# Patient Record
Sex: Male | Born: 1960 | Race: Black or African American | Hispanic: No | Marital: Married | Smoking: Former smoker
Health system: Southern US, Community
[De-identification: ages and names within clinical notes are randomized; demographics above are authoritative.]

## PROBLEM LIST (undated history)

## (undated) DIAGNOSIS — M549 Dorsalgia, unspecified: Secondary | ICD-10-CM

## (undated) DIAGNOSIS — J189 Pneumonia, unspecified organism: Secondary | ICD-10-CM

---

## 2015-02-22 ENCOUNTER — Emergency Department (HOSPITAL_COMMUNITY): Payer: PRIVATE HEALTH INSURANCE

## 2015-02-22 ENCOUNTER — Encounter (HOSPITAL_COMMUNITY): Payer: Self-pay | Admitting: *Deleted

## 2015-02-22 ENCOUNTER — Emergency Department (HOSPITAL_COMMUNITY)
Admission: EM | Admit: 2015-02-22 | Discharge: 2015-02-22 | Disposition: A | Discharge status: Home / Self Care | Payer: PRIVATE HEALTH INSURANCE | Attending: Emergency Medicine | Admitting: Emergency Medicine

## 2015-02-22 DIAGNOSIS — M6281 Muscle weakness (generalized): Secondary | ICD-10-CM | POA: Diagnosis not present

## 2015-02-22 DIAGNOSIS — R200 Anesthesia of skin: Secondary | ICD-10-CM | POA: Diagnosis present

## 2015-02-22 DIAGNOSIS — Z87891 Personal history of nicotine dependence: Secondary | ICD-10-CM | POA: Diagnosis not present

## 2015-02-22 DIAGNOSIS — R29898 Other symptoms and signs involving the musculoskeletal system: Secondary | ICD-10-CM | POA: Diagnosis not present

## 2015-02-22 LAB — DIFFERENTIAL
BASOS ABS: 0 10*3/uL (ref 0.0–0.1)
Basophils Relative: 1 % (ref 0–1)
EOS ABS: 0.1 10*3/uL (ref 0.0–0.7)
Eosinophils Relative: 4 % (ref 0–5)
LYMPHS PCT: 29 % (ref 12–46)
Lymphs Abs: 1.1 10*3/uL (ref 0.7–4.0)
MONO ABS: 0.3 10*3/uL (ref 0.1–1.0)
Monocytes Relative: 7 % (ref 3–12)
NEUTROS ABS: 2.2 10*3/uL (ref 1.7–7.7)
Neutrophils Relative %: 59 % (ref 43–77)

## 2015-02-22 LAB — COMPREHENSIVE METABOLIC PANEL
ALT: 20 U/L (ref 17–63)
AST: 25 U/L (ref 15–41)
Albumin: 4.1 g/dL (ref 3.5–5.0)
Alkaline Phosphatase: 62 U/L (ref 38–126)
Anion gap: 7 (ref 5–15)
BUN: 12 mg/dL (ref 6–20)
CHLORIDE: 100 mmol/L — AB (ref 101–111)
CO2: 32 mmol/L (ref 22–32)
CREATININE: 1.17 mg/dL (ref 0.61–1.24)
Calcium: 9.5 mg/dL (ref 8.9–10.3)
GFR calc non Af Amer: >60 mL/min (ref >60)
Glucose, Bld: 146 mg/dL — ABNORMAL HIGH (ref 65–99)
POTASSIUM: 3.7 mmol/L (ref 3.5–5.1)
SODIUM: 139 mmol/L (ref 135–145)
Total Bilirubin: 0.6 mg/dL (ref 0.3–1.2)
Total Protein: 7.4 g/dL (ref 6.5–8.1)

## 2015-02-22 LAB — CBC
HEMATOCRIT: 40.3 % (ref 39.0–52.0)
HEMOGLOBIN: 13.3 g/dL (ref 13.0–17.0)
MCH: 22.8 pg — ABNORMAL LOW (ref 26.0–34.0)
MCHC: 33 g/dL (ref 30.0–36.0)
MCV: 69 fL — ABNORMAL LOW (ref 78.0–100.0)
PLATELETS: 207 10*3/uL (ref 150–400)
RBC: 5.84 MIL/uL — AB (ref 4.22–5.81)
RDW: 16 % — AB (ref 11.5–15.5)
WBC: 3.7 10*3/uL — ABNORMAL LOW (ref 4.0–10.5)

## 2015-02-22 LAB — I-STAT CHEM 8, ED
BUN: 15 mg/dL (ref 6–20)
Calcium, Ion: 1.26 mmol/L — ABNORMAL HIGH (ref 1.12–1.23)
Chloride: 98 mmol/L — ABNORMAL LOW (ref 101–111)
Creatinine, Ser: 1.2 mg/dL (ref 0.61–1.24)
Glucose, Bld: 148 mg/dL — ABNORMAL HIGH (ref 65–99)
HEMATOCRIT: 47 % (ref 39.0–52.0)
HEMOGLOBIN: 16 g/dL (ref 13.0–17.0)
POTASSIUM: 3.7 mmol/L (ref 3.5–5.1)
SODIUM: 140 mmol/L (ref 135–145)
TCO2: 30 mmol/L (ref 0–100)

## 2015-02-22 LAB — I-STAT TROPONIN, ED
TROPONIN I, POC: 0 ng/mL (ref 0.00–0.08)
TROPONIN I, POC: 0 ng/mL (ref 0.00–0.08)

## 2015-02-22 LAB — PROTIME-INR
INR: 1.07 (ref 0.00–1.49)
Prothrombin Time: 14.1 seconds (ref 11.6–15.2)

## 2015-02-22 LAB — APTT: aPTT: 33 seconds (ref 24–37)

## 2015-02-22 LAB — CBG MONITORING, ED: GLUCOSE-CAPILLARY: 153 mg/dL — AB (ref 65–99)

## 2015-02-22 NOTE — ED Provider Notes (Signed)
History   Chief Complaint  Patient presents with  . Numbness    HPI Previously healthy 54 year old male who states he has no medical problems although states he has not follow with her regular doctor presents to ED for evaluation of right upper extremity numbness and weakness which began after waking up this afternoon at 2 PM. Patient reports he went to bed around 2 AM and was in his usual normal state of health prior to this. Patient denies history of similar symptoms in the past. Denies other symptoms and specifically denies any chest pain, shortness breath, fevers, chills. Patient states in symptoms began his weakness and numbness are improving. Denies sleeping on the arm abnormally. Patient also denies neck pain or prior injury to his neck. Denies arm pain. Patient denies any family history of cardiac disease.  Past medical/surgical history, social history, medications, allergies and FH have been reviewed with patient and/or in documentation. Furthermore, if pt family or friend(s) present, additional historical information was obtained from them.  History reviewed. No pertinent past medical history. History reviewed. No pertinent past surgical history. No family history on file. Social History  Substance Use Topics  . Smoking status: Former Games developer  . Smokeless tobacco: None  . Alcohol Use: Yes     Comment: social     Review of Systems Constitutional: - F/C, -fatigue.  HENT: - congestion, -rhinorrhea, -sore throat.   Eyes: - eye pain, -visual disturbance.  Respiratory: - cough, -SOB, -hemoptysis.   Cardiovascular: - CP, -palps.  Gastrointestinal: - N/V/D, -abd pain  Genitourinary: - flank pain, -dysuria, -frequency.  Musculoskeletal: - myalgia/arthritis, -joint swelling, -gait abnormality, -back pain, -neck pain/stiffness, -leg pain/swelling.  Skin: - rash/lesion.  Neurological: + focal weakness, -lightheadedness, -dizziness, +numbness, -HA.  All other systems reviewed and are  negative.   Physical Exam  Physical Exam  ED Triage Vitals  Enc Vitals Group     BP 02/22/15 1930 139/96 mmHg     Pulse Rate 02/22/15 1930 62     Resp 02/22/15 1930 15     Temp --      Temp src --      SpO2 02/22/15 1930 100 %     Weight --      Height --      Head Cir --      Peak Flow --      Pain Score --      Pain Loc --      Pain Edu? --      Excl. in GC? --    Constitutional: Patient is well appearing and in no acute distress Head: Normocephalic and atraumatic.  Eyes: Extraocular motion intact, no scleral icterus Mouth: MMM, OP clear Neck: Supple without meningismus, mass, or overt JVD. Nontender neck. Respiratory: No respiratory distress. Normal WOB. No w/r/g. CV: RRR, no obvious murmurs.  Pulses +2 and symmetric. Euvolemic Abdomen: Soft, NT, ND, no r/g. No mass.  MSK: Nontender right upper extremity. No signs of trauma. Extremities are atraumatic without deformity, ROM intact Skin: Warm, dry, intact without rash Neuro: AAOx4, patient has weakness in his right upper extremity with weakness 4/5 primarily with biceps flexion. Wrist extension, arm extension are 5/5. Remainder extremity is 5/5. Patient has decreased sensation in the radial distribution involving the forearm thumb index and middle fingers. Patient has normal sensation in the ring and small finger in the medial portion of his forearm.    ED Course  Procedures   Labs Reviewed  CBC - Abnormal; Notable  for the following:    WBC 3.7 (*)    RBC 5.84 (*)    MCV 69.0 (*)    MCH 22.8 (*)    RDW 16.0 (*)    All other components within normal limits  COMPREHENSIVE METABOLIC PANEL - Abnormal; Notable for the following:    Chloride 100 (*)    Glucose, Bld 146 (*)    All other components within normal limits  CBG MONITORING, ED - Abnormal; Notable for the following:    Glucose-Capillary 153 (*)    All other components within normal limits  I-STAT CHEM 8, ED - Abnormal; Notable for the following:     Chloride 98 (*)    Glucose, Bld 148 (*)    Calcium, Ion 1.26 (*)    All other components within normal limits  PROTIME-INR  APTT  DIFFERENTIAL  I-STAT TROPOININ, ED  I-STAT TROPOININ, ED  I-STAT TROPOININ, ED  I-STAT TROPOININ, ED   I personally reviewed and interpreted all labs.  No results found. I personally viewed above image(s) which were used in my medical decision making. Formal interpretations by Radiology.   EKG Interpretation  Date/Time:  Sunday February 22 2015 18:37:29 EDT Ventricular Rate:  63 PR Interval:  156 QRS Duration: 98 QT Interval:  394 QTC Calculation: 403 R Axis:   -20 Text Interpretation:  Normal sinus rhythm Right atrial enlargement Incomplete right bundle branch block ST \\T \ T wave abnormality, consider inferior ischemia ST \\T \ T wave abnormality, consider anterior ischemia Abnormal ECG No old tracing to compare Confirmed by MILLER  MD, BRIAN ([id number]) on 02/22/2015 7:43:33 PM       MDM: Kaseem Vastine is a 54 y.o. male with H&P as above who p/w CC: Numbness.  On arrival patient is medically stable and in no apparent distress. Patient is neuro exam as above which is concerning for peripheral radiculopathy versus TIA. Patient will get screening labs, head CT, EKG.  EKG is abnormal and shows normal sinus rhythm, right atrial enlargement, incomplete right bundle branch block, T-wave inversions in the inferior and septal leads. No ST elevation or depression. No old EKG for comparison. Patient denies any chest pain, other symptoms concerning for ACS and denies having family history of cardiac disease.  Neurology was consult for further evaluation. Neurology recommends MRI for further evaluation. MRI is negative for acute infarct. Neurology feels patient is stable for discharge home with outpatient neurology follow-up for EMG. Cardiology was consulted and they reviewed patient's EKG over the phone and recommended patient have outpatient cardiology  evaluation. Patient remained stable while in ED and symptoms are reported to be improved. Patient is deemed stable for discharge. Old records reviewed (if available). Labs and imaging reviewed personally by myself and considered in medical decision making if ordered.  Clinical Impression: 1. Arm weakness     Disposition: Discharge  Condition: Good  I have discussed the results, Dx and Tx plan with the pt(& family if present). He/she/they expressed understanding and agree(s) with the plan. Discharge instructions discussed at great length. Strict return precautions discussed and pt &/or family have verbalized understanding of the instructions. No further questions at time of discharge.    New Prescriptions   No medications on file    Follow Up: [facility] EMERGENCY DEPARTMENT [address] [id number] mc [place] [address] [phone]  If symptoms worsen  [facility] [address] [place] [address] [phone] Schedule an appointment as soon as possible for  a visit in 1 week for EMG study  [facility] CARDIOVASCULAR DIVISION [address] [place] [address] [phone] Schedule an appointment as soon as possible for a visit in 1 week    Pt seen in conjunction with Dr. Eber Hong, MD  Ames Dura, DO [facility] Emergency Medicine Resident - PGY-3     Ames Dura, MD 02/22/15 [id number]  Eber Hong, MD 02/22/15 [phone]

## 2015-02-22 NOTE — ED Notes (Signed)
Patient transported to CT 

## 2015-02-22 NOTE — Consult Note (Addendum)
Referring Physician: ED    Chief Complaint: acute onset right arm weakness/numbness  HPI:                                                                                                                                         Taylor [name] is an 54 y.o. male without known significant past medical history, presents to the ED for evaluation of the above stated symptoms. Stated that he never had similar symptoms before, but woke up from at 2 pm today and realized that his right arm was " clumsy, numb, and couldn't pick up a glass and put it in a table". He said that there is not pain in the right UE or neck, no re\cent neck or arm trauma. The numbness involves the right thumb-index finger-right forearm. Mr. Cullens denies associated HA, vertigo, double vision, difficulty swallowing, slurred speech, language or vision impairment. No weakness of the right leg. CT brain performed today was independently reviewed and showed no acute abnormality.  Date last known well: 02/22/15 Time last known well: unclear tPA Given: no, late presentation   History reviewed. No pertinent past medical history.  History reviewed. No pertinent past surgical history.  No family history on file. Social History:  reports that he has quit smoking. He does not have any smokeless tobacco history on file. He reports that he drinks alcohol. He reports that he does not use illicit drugs.  Family history: no MS, epilepsy, brain tumor, or brain aneurysm Allergies: No Known Allergies  Medications:                                                                                                                           I have reviewed the patient's current medications.  ROS:  History obtained from chart review and the patient  General ROS: negative for - chills, fatigue, fever, night sweats, weight  gain or weight loss Psychological ROS: negative for - behavioral disorder, hallucinations, memory difficulties, mood swings or suicidal ideation Ophthalmic ROS: negative for - blurry vision, double vision, eye pain or loss of vision ENT ROS: negative for - epistaxis, nasal discharge, oral lesions, sore throat, tinnitus or vertigo Allergy and Immunology ROS: negative for - hives or itchy/watery eyes Hematological and Lymphatic ROS: negative for - bleeding problems, bruising or swollen lymph nodes Endocrine ROS: negative for - galactorrhea, hair pattern changes, polydipsia/polyuria or temperature intolerance Respiratory ROS: negative for - cough, hemoptysis, shortness of breath or wheezing Cardiovascular ROS: negative for - chest pain, dyspnea on exertion, edema or irregular heartbeat Gastrointestinal ROS: negative for - abdominal pain, diarrhea, hematemesis, nausea/vomiting or stool incontinence Genito-Urinary ROS: negative for - dysuria, hematuria, incontinence or urinary frequency/urgency Musculoskeletal ROS: negative for - joint swelling or muscular weakness Neurological ROS: as noted in HPI Dermatological ROS: negative for rash and skin lesion changes   Physical exam:  Constitutional: well developed, pleasant male in no apparent distress. Blood pressure 110/55, pulse 82, resp. rate 13, SpO2 99 %. Eyes: no jaundice or exophthalmos.  Head: normocephalic. Neck: supple, no bruits, no JVD. Cardiac: no murmurs. Lungs: clear. Abdomen: soft, no tender, no mass. Extremities: no edema, clubbing, or cyanosis.  Skin: no rash  Neurologic Examination:                                                                                                      General: Mental Status: Alert, oriented, thought content appropriate.  Speech fluent without evidence of aphasia.  Able to follow 3 step commands without difficulty. Cranial Nerves: II: Discs flat bilaterally; Visual fields grossly normal, pupils  equal, round, reactive to light and accommodation III,IV, VI: ptosis not present, extra-ocular motions intact bilaterally V,VII: smile symmetric, facial light touch sensation normal bilaterally VIII: hearing normal bilaterally IX,X: uvula rises symmetrically XI: bilateral shoulder shrug XII: midline tongue extension without atrophy or fasciculations Motor: 5/5 all over except for 4/5 weakness right biceps Tone and bulk:normal tone throughout; no atrophy noted Sensory: Pinprick and light touch diminished right forearm-right thumb-index fingers Deep Tendon Reflexes:  Right: Upper Extremity   Left: Upper extremity   biceps (C-5 to C-6) 2/4   biceps (C-5 to C-6) 2/4 tricep (C7) 2/4    triceps (C7) 2/4 Brachioradialis (C6) 2/4  Brachioradialis (C6) 2/4  Lower Extremity Lower Extremity  quadriceps (L-2 to L-4) 2/4   quadriceps (L-2 to L-4) 2/4 Achilles (S1) 2/4   Achilles (S1) 2/4  Plantars: Right: downgoing   Left: downgoing Cerebellar: normal finger-to-nose,  normal heel-to-shin test Gait:  No ataxia    Results for orders placed or performed during the hospital encounter of 02/22/15 (from the past 48 hour(s))  CBG monitoring, ED     Status: Abnormal   Collection Time: 02/22/15  6:35 PM  Result Value Ref Range   Glucose-Capillary 153 (H) 65 - 99 mg/dL  I-stat troponin, ED (not  at [facility], [facility])     Status: None   Collection Time: 02/22/15  6:53 PM  Result Value Ref Range   Troponin i, poc 0.00 0.00 - 0.08 ng/mL   Comment 3            Comment: Due to the release kinetics of cTnI, a negative result within the first hours of the onset of symptoms does not rule out myocardial infarction with certainty. If myocardial infarction is still suspected, repeat the test at appropriate intervals.   I-Stat Chem 8, ED  (not at [facility], [facility])     Status: Abnormal   Collection Time: 02/22/15  6:55 PM  Result Value Ref Range   Sodium 140 135 - 145 mmol/L   Potassium 3.7 3.5 - 5.1 mmol/L    Chloride 98 (L) 101 - 111 mmol/L   BUN 15 6 - 20 mg/dL   Creatinine, Ser 1.20 0.61 - 1.24 mg/dL   Glucose, Bld 148 (H) 65 - 99 mg/dL   Calcium, Ion 1.26 (H) 1.12 - 1.23 mmol/L   TCO2 30 0 - 100 mmol/L   Hemoglobin 16.0 13.0 - 17.0 g/dL   HCT 47.0 39.0 - 52.0 %  Protime-INR     Status: None   Collection Time: 02/22/15  7:10 PM  Result Value Ref Range   Prothrombin Time 14.1 11.6 - 15.2 seconds   INR 1.07 0.00 - 1.49  APTT     Status: None   Collection Time: 02/22/15  7:10 PM  Result Value Ref Range   aPTT 33 24 - 37 seconds  CBC     Status: Abnormal   Collection Time: 02/22/15  7:10 PM  Result Value Ref Range   WBC 3.7 (L) 4.0 - 10.5 K/uL   RBC 5.84 (H) 4.22 - 5.81 MIL/uL   Hemoglobin 13.3 13.0 - 17.0 g/dL   HCT 40.3 39.0 - 52.0 %   MCV 69.0 (L) 78.0 - 100.0 fL   MCH 22.8 (L) 26.0 - 34.0 pg   MCHC 33.0 30.0 - 36.0 g/dL   RDW 16.0 (H) 11.5 - 15.5 %   Platelets 207 150 - 400 K/uL  Differential     Status: None   Collection Time: 02/22/15  7:10 PM  Result Value Ref Range   Neutrophils Relative % 59 43 - 77 %   Lymphocytes Relative 29 12 - 46 %   Monocytes Relative 7 3 - 12 %   Eosinophils Relative 4 0 - 5 %   Basophils Relative 1 0 - 1 %   Neutro Abs 2.2 1.7 - 7.7 K/uL   Lymphs Abs 1.1 0.7 - 4.0 K/uL   Monocytes Absolute 0.3 0.1 - 1.0 K/uL   Eosinophils Absolute 0.1 0.0 - 0.7 K/uL   Basophils Absolute 0.0 0.0 - 0.1 K/uL  Comprehensive metabolic panel     Status: Abnormal   Collection Time: 02/22/15  7:10 PM  Result Value Ref Range   Sodium 139 135 - 145 mmol/L   Potassium 3.7 3.5 - 5.1 mmol/L   Chloride 100 (L) 101 - 111 mmol/L   CO2 32 22 - 32 mmol/L   Glucose, Bld 146 (H) 65 - 99 mg/dL   BUN 12 6 - 20 mg/dL   Creatinine, Ser 1.17 0.61 - 1.24 mg/dL   Calcium 9.5 8.9 - 10.3 mg/dL   Total Protein 7.4 6.5 - 8.1 g/dL   Albumin 4.1 3.5 - 5.0 g/dL   AST 25 15 - 41 U/L   ALT 20  17 - 63 U/L   Alkaline Phosphatase 62 38 - 126 U/L   Total Bilirubin 0.6 0.3 - 1.2 mg/dL    GFR calc non Af Amer >60 >60 mL/min   GFR calc Af Amer >60 >60 mL/min    Comment: (NOTE) The eGFR has been calculated using the CKD EPI equation. This calculation has not been validated in all clinical situations. eGFR's persistently <60 mL/min signify possible Chronic Kidney Disease.    Anion gap 7 5 - 15  I-Stat Troponin, ED - 0, 3, 6 hours (not at [facility])     Status: None   Collection Time: 02/22/15  7:59 PM  Result Value Ref Range   Troponin i, poc 0.00 0.00 - 0.08 ng/mL   Comment 3            Comment: Due to the release kinetics of cTnI, a negative result within the first hours of the onset of symptoms does not rule out myocardial infarction with certainty. If myocardial infarction is still suspected, repeat the test at appropriate intervals.    Ct Head Wo Contrast  02/22/2015   CLINICAL DATA:  Right-sided numbness and weakness beginning this morning. Initial encounter.  EXAM: CT HEAD WITHOUT CONTRAST  TECHNIQUE: Contiguous axial images were obtained from the base of the skull through the vertex without intravenous contrast.  COMPARISON:  None.  FINDINGS: The brain appears normal without hemorrhage, infarct, mass lesion, mass effect, midline shift or abnormal extra-axial fluid collection. No hydrocephalus or pneumocephalus. Imaged paranasal sinuses and mastoid air cells are clear. The calvarium is intact.  IMPRESSION: Negative head CT.   Electronically Signed   By: Inge Rise M.D.   On: 02/22/2015 20:07    Assessment: 54 y.o. male presents with acute onset right arm clumsiness/weakness and numbness right forearm-right thumb-index fingers. On neuro exam the weakness seems to be restricted to the right biceps, particularly no involvement of right UE muscles supply by the radial nerve. Pattern of weakness and sensory involvement not entirely consistent with a peripheral process, thus will recommend MRI brain to exclude the possibility of a small cortical infarct. Will follow up after  MRI.  Stroke Risk Factors - none  Dorian Pod ,MD [facility] [phone]  02/22/2015, 8:58 PM  Addendum:MRI brain negative for acute abnormality or other lesion that could explain his symptoms. D/C home with outpatient neurology follow up, may need NCS/EMG if symptoms persist.  Dorian Pod, MD

## 2015-02-22 NOTE — ED Provider Notes (Signed)
The patient is a 54 year old male, presents with right arm numbness and weakness which she discovered when he woke up at 2:00 PM. He went to sleep at 2:00 AM, he was normal at that time. He noticed that he could not hold his CAT very well. He denies any other symptoms including chest pain, shortness of breath, headache, blurred vision, speech difficulties or gait difficulties and has no complaints of his legs. On exam the patient has normal heart and lungs, normal abdomen, cranial nerves III through XII are normal, normal strength and sensation in the bilateral lower extremities, left upper extremities normal to strength and sensation, right upper extremity has some weakness at the biceps, no obvious weakness in the grip, no weakness with arm extension, slight numbness to the thumb and second finger, normal sensation to the  ulnar 3 fingers. normal extension at the wrist bilaterally. He does not report any changes in the way that he slept last night, he does have a radial nerve-type sensory distribution however the weakness does not correlate, we'll obtain labs, CT, discussed with neurology. In addition his EKG is very abnormal with T wave inversions and abnormal septal leads. He will need discussion with neurology, possibly cardiology.  I saw and evaluated the patient, reviewed the resident's note and I agree with the findings and plan.   EKG Interpretation  Date/Time:  Sunday February 22 2015 18:37:29 EDT Ventricular Rate:  63 PR Interval:  156 QRS Duration: 98 QT Interval:  394 QTC Calculation: 403 R Axis:   -20 Text Interpretation:  Normal sinus rhythm Right atrial enlargement Incomplete right bundle branch block ST \\T \ T wave abnormality, consider inferior ischemia ST \\T \ T wave abnormality, consider anterior ischemia Abnormal ECG No old tracing to compare Confirmed by Jaisa Defino  MD, Yeilyn Gent ([id number]) on 02/22/2015 7:43:33 PM        Final diagnoses:  Arm weakness        Eber Hong,  MD 02/22/15 [phone]

## 2015-02-22 NOTE — ED Notes (Signed)
Patient returned from CT

## 2015-02-22 NOTE — ED Notes (Signed)
Spoke with Dr. Hyacinth Meeker in regards to troponin order, states pt ok to be discharged.

## 2015-02-22 NOTE — ED Notes (Signed)
Pt states that he woke up at 1400 today with rt arm numbness. States he went to sleep at 0200. States he was fishing all day Friday. No other neuro deficits noted. Spoke with Dr Hyacinth Meeker regarding pt. Instructed to order stroke order set but not call code stroke.

## 2016-01-03 ENCOUNTER — Ambulatory Visit (HOSPITAL_COMMUNITY)
Admission: EM | Admit: 2016-01-03 | Discharge: 2016-01-03 | Disposition: A | Discharge status: Home / Self Care | Payer: BLUE CROSS/BLUE SHIELD | Attending: Emergency Medicine | Admitting: Emergency Medicine

## 2016-01-03 ENCOUNTER — Encounter (HOSPITAL_COMMUNITY): Payer: Self-pay | Admitting: Emergency Medicine

## 2016-01-03 DIAGNOSIS — M5441 Lumbago with sciatica, right side: Secondary | ICD-10-CM

## 2016-01-03 HISTORY — DX: Dorsalgia, unspecified: M54.9

## 2016-01-03 MED ORDER — CYCLOBENZAPRINE HCL 10 MG PO TABS: 10.0000 mg | ORAL_TABLET | Freq: Three times a day (TID) | ORAL | Status: DC | PRN

## 2016-01-03 MED ORDER — MELOXICAM 15 MG PO TABS: 15.0000 mg | ORAL_TABLET | Freq: Every day | ORAL | Status: DC
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
## 2016-01-03 NOTE — ED Notes (Addendum)
Back pain, pain into right leg, describes pain as spasm

## 2016-01-03 NOTE — ED Notes (Signed)
On discharge, patient asked about extending work note one day, dr Piedad Climeshonig agreed

## 2016-01-03 NOTE — Discharge Instructions (Signed)
It sounds like you had sciatica coming from muscle spasms. It looks like this is improving and healing well. Take meloxicam daily for the next 3-4 days, then as needed for pain. Use the Flexeril 3 times a day as needed. Apply heat several times a day, particularly when you are done with work. Do gentle stretching of your back, particularly after work. Follow-up as needed.

## 2017-01-21 ENCOUNTER — Emergency Department (HOSPITAL_COMMUNITY): Payer: Self-pay

## 2017-01-21 ENCOUNTER — Inpatient Hospital Stay (HOSPITAL_COMMUNITY)
Admission: EM | Admit: 2017-01-21 | Discharge: 2017-01-26 | DRG: 091 | Disposition: A | Discharge status: Home / Self Care | Payer: Self-pay | Attending: Family Medicine | Admitting: Family Medicine

## 2017-01-21 ENCOUNTER — Encounter (HOSPITAL_COMMUNITY): Payer: Self-pay | Admitting: Internal Medicine

## 2017-01-21 DIAGNOSIS — R778 Other specified abnormalities of plasma proteins: Secondary | ICD-10-CM

## 2017-01-21 DIAGNOSIS — G92 Toxic encephalopathy: Principal | ICD-10-CM | POA: Diagnosis present

## 2017-01-21 DIAGNOSIS — G929 Unspecified toxic encephalopathy: Secondary | ICD-10-CM | POA: Diagnosis present

## 2017-01-21 DIAGNOSIS — T50901A Poisoning by unspecified drugs, medicaments and biological substances, accidental (unintentional), initial encounter: Secondary | ICD-10-CM

## 2017-01-21 DIAGNOSIS — T401X1A Poisoning by heroin, accidental (unintentional), initial encounter: Secondary | ICD-10-CM | POA: Diagnosis not present

## 2017-01-21 DIAGNOSIS — Z791 Long term (current) use of non-steroidal anti-inflammatories (NSAID): Secondary | ICD-10-CM

## 2017-01-21 DIAGNOSIS — R410 Disorientation, unspecified: Secondary | ICD-10-CM

## 2017-01-21 DIAGNOSIS — I161 Hypertensive emergency: Secondary | ICD-10-CM | POA: Diagnosis present

## 2017-01-21 DIAGNOSIS — I21A1 Myocardial infarction type 2: Secondary | ICD-10-CM | POA: Diagnosis present

## 2017-01-21 DIAGNOSIS — R931 Abnormal findings on diagnostic imaging of heart and coronary circulation: Secondary | ICD-10-CM

## 2017-01-21 DIAGNOSIS — F1114 Opioid abuse with opioid-induced mood disorder: Secondary | ICD-10-CM

## 2017-01-21 DIAGNOSIS — F1110 Opioid abuse, uncomplicated: Secondary | ICD-10-CM | POA: Diagnosis present

## 2017-01-21 DIAGNOSIS — F39 Unspecified mood [affective] disorder: Secondary | ICD-10-CM | POA: Diagnosis present

## 2017-01-21 DIAGNOSIS — D509 Iron deficiency anemia, unspecified: Secondary | ICD-10-CM | POA: Diagnosis present

## 2017-01-21 DIAGNOSIS — T402X5A Adverse effect of other opioids, initial encounter: Secondary | ICD-10-CM | POA: Diagnosis present

## 2017-01-21 DIAGNOSIS — R7881 Bacteremia: Secondary | ICD-10-CM

## 2017-01-21 DIAGNOSIS — E873 Alkalosis: Secondary | ICD-10-CM

## 2017-01-21 DIAGNOSIS — A4159 Other Gram-negative sepsis: Secondary | ICD-10-CM | POA: Diagnosis not present

## 2017-01-21 DIAGNOSIS — R7989 Other specified abnormal findings of blood chemistry: Secondary | ICD-10-CM

## 2017-01-21 DIAGNOSIS — Z87891 Personal history of nicotine dependence: Secondary | ICD-10-CM

## 2017-01-21 DIAGNOSIS — R52 Pain, unspecified: Secondary | ICD-10-CM

## 2017-01-21 DIAGNOSIS — E876 Hypokalemia: Secondary | ICD-10-CM | POA: Diagnosis present

## 2017-01-21 DIAGNOSIS — B961 Klebsiella pneumoniae [K. pneumoniae] as the cause of diseases classified elsewhere: Secondary | ICD-10-CM

## 2017-01-21 DIAGNOSIS — J9601 Acute respiratory failure with hypoxia: Secondary | ICD-10-CM | POA: Diagnosis present

## 2017-01-21 DIAGNOSIS — M542 Cervicalgia: Secondary | ICD-10-CM

## 2017-01-21 DIAGNOSIS — J690 Pneumonitis due to inhalation of food and vomit: Secondary | ICD-10-CM | POA: Diagnosis present

## 2017-01-21 DIAGNOSIS — T50904A Poisoning by unspecified drugs, medicaments and biological substances, undetermined, initial encounter: Secondary | ICD-10-CM

## 2017-01-21 LAB — CBC WITH DIFFERENTIAL/PLATELET
Basophils Absolute: 0 10*3/uL (ref 0.0–0.1)
Basophils Relative: 0 %
Eosinophils Absolute: 0.1 10*3/uL (ref 0.0–0.7)
Eosinophils Relative: 1 %
HCT: 33.2 % — ABNORMAL LOW (ref 39.0–52.0)
Hemoglobin: 11.1 g/dL — ABNORMAL LOW (ref 13.0–17.0)
Lymphocytes Relative: 9 %
Lymphs Abs: 0.6 10*3/uL — ABNORMAL LOW (ref 0.7–4.0)
MCH: 21.6 pg — ABNORMAL LOW (ref 26.0–34.0)
MCHC: 33.4 g/dL (ref 30.0–36.0)
MCV: 64.7 fL — ABNORMAL LOW (ref 78.0–100.0)
Monocytes Absolute: 0.5 10*3/uL (ref 0.1–1.0)
Monocytes Relative: 7 %
Neutro Abs: 5.3 10*3/uL (ref 1.7–7.7)
Neutrophils Relative %: 83 %
Platelets: 203 10*3/uL (ref 150–400)
RBC: 5.13 MIL/uL (ref 4.22–5.81)
RDW: 16.1 % — ABNORMAL HIGH (ref 11.5–15.5)
WBC: 6.5 10*3/uL (ref 4.0–10.5)

## 2017-01-21 LAB — RAPID URINE DRUG SCREEN, HOSP PERFORMED
Amphetamines: NOT DETECTED
Barbiturates: NOT DETECTED
Benzodiazepines: POSITIVE — AB
COCAINE: NOT DETECTED
OPIATES: POSITIVE — AB
TETRAHYDROCANNABINOL: NOT DETECTED

## 2017-01-21 LAB — BLOOD GAS, ARTERIAL
Acid-Base Excess: 1.1 mmol/L (ref 0.0–2.0)
Bicarbonate: 24.3 mmol/L (ref 20.0–28.0)
Drawn by: 257881
FIO2: 100
MECHVT: 620 mL
O2 Saturation: 99.9 %
PEEP: 5 cmH2O
Patient temperature: 98.6
RATE: 18 resp/min
pCO2 arterial: 35.3 mmHg (ref 32.0–48.0)
pH, Arterial: 7.452 — ABNORMAL HIGH (ref 7.350–7.450)
pO2, Arterial: 447 mmHg — ABNORMAL HIGH (ref 83.0–108.0)

## 2017-01-21 LAB — COMPREHENSIVE METABOLIC PANEL
ALT: 48 U/L (ref 17–63)
AST: 66 U/L — ABNORMAL HIGH (ref 15–41)
Albumin: 3.7 g/dL (ref 3.5–5.0)
Alkaline Phosphatase: 98 U/L (ref 38–126)
Anion gap: 10 (ref 5–15)
BUN: 10 mg/dL (ref 6–20)
CO2: 26 mmol/L (ref 22–32)
Calcium: 8.7 mg/dL — ABNORMAL LOW (ref 8.9–10.3)
Chloride: 102 mmol/L (ref 101–111)
Creatinine, Ser: 1.17 mg/dL (ref 0.61–1.24)
GFR calc Af Amer: >60 mL/min (ref >60)
GFR calc non Af Amer: >60 mL/min (ref >60)
Glucose, Bld: 123 mg/dL — ABNORMAL HIGH (ref 65–99)
Potassium: 3.2 mmol/L — ABNORMAL LOW (ref 3.5–5.1)
Sodium: 138 mmol/L (ref 135–145)
Total Bilirubin: 0.3 mg/dL (ref 0.3–1.2)
Total Protein: 7.6 g/dL (ref 6.5–8.1)

## 2017-01-21 LAB — URINALYSIS, ROUTINE W REFLEX MICROSCOPIC
Bilirubin Urine: NEGATIVE
Glucose, UA: NEGATIVE mg/dL
Ketones, ur: 5 mg/dL — AB
Leukocytes, UA: NEGATIVE
Nitrite: NEGATIVE
Protein, ur: NEGATIVE mg/dL
Specific Gravity, Urine: 1.005 (ref 1.005–1.030)
Squamous Epithelial / LPF: NONE SEEN
pH: 8 (ref 5.0–8.0)

## 2017-01-21 LAB — ETHANOL: Alcohol, Ethyl (B): <5 mg/dL (ref <5)

## 2017-01-21 LAB — MAGNESIUM: Magnesium: 1.8 mg/dL (ref 1.7–2.4)

## 2017-01-21 LAB — TROPONIN I
Troponin I: 0.06 ng/mL (ref <0.03)
Troponin I: 0.11 ng/mL (ref <0.03)
Troponin I: 0.1 ng/mL (ref <0.03)

## 2017-01-21 LAB — CK: Total CK: 606 U/L — ABNORMAL HIGH (ref 49–397)

## 2017-01-21 LAB — SALICYLATE LEVEL: Salicylate Lvl: <7 mg/dL (ref 2.8–30.0)

## 2017-01-21 LAB — LACTIC ACID, PLASMA: Lactic Acid, Venous: 1.4 mmol/L (ref 0.5–1.9)

## 2017-01-21 LAB — ACETAMINOPHEN LEVEL: Acetaminophen (Tylenol), Serum: <10 ug/mL — ABNORMAL LOW (ref 10–30)

## 2017-01-21 MED ORDER — SODIUM CHLORIDE 0.9 % IV SOLN: 250.0000 mL | INTRAVENOUS | Status: DC | PRN

## 2017-01-21 MED ORDER — NALOXONE HCL 0.4 MG/ML IJ SOLN: 0.4000 mg | Freq: Once | INTRAMUSCULAR | Status: DC

## 2017-01-21 MED ORDER — SODIUM CHLORIDE 0.9 % IV BOLUS (SEPSIS)
1000.0000 mL | Freq: Once | INTRAVENOUS | Status: AC
  Administered 2017-01-21: 1000 mL via INTRAVENOUS

## 2017-01-21 MED ORDER — NALOXONE HCL 0.4 MG/ML IJ SOLN
0.2000 mg | Freq: Once | INTRAMUSCULAR | Status: AC
  Administered 2017-01-21: 0.2 mg via INTRAVENOUS

## 2017-01-21 MED ORDER — IPRATROPIUM-ALBUTEROL 0.5-2.5 (3) MG/3ML IN SOLN
3.0000 mL | Freq: Four times a day (QID) | RESPIRATORY_TRACT | Status: DC
  Administered 2017-01-22 – 2017-01-23 (×2): 3 mL via RESPIRATORY_TRACT
  Filled 2017-01-21 (×3): qty 3

## 2017-01-21 MED ORDER — PROPOFOL 1000 MG/100ML IV EMUL
5.0000 ug/kg/min | Freq: Once | INTRAVENOUS | Status: DC
  Administered 2017-01-21: 5 ug/kg/min via INTRAVENOUS

## 2017-01-21 MED ORDER — PROPOFOL 1000 MG/100ML IV EMUL
5.0000 ug/kg/min | INTRAVENOUS | Status: DC
  Administered 2017-01-22: 38.4 ug/kg/min via INTRAVENOUS
  Administered 2017-01-22 (×4): 40 ug/kg/min via INTRAVENOUS
  Filled 2017-01-21 (×5): qty 100

## 2017-01-21 MED ORDER — PROPOFOL 1000 MG/100ML IV EMUL
INTRAVENOUS | Status: AC
  Filled 2017-01-21: qty 100

## 2017-01-21 MED ORDER — ACETAMINOPHEN 325 MG PO TABS
650.0000 mg | ORAL_TABLET | ORAL | Status: DC | PRN
  Administered 2017-01-22 – 2017-01-25 (×6): 650 mg via ORAL
  Filled 2017-01-21 (×6): qty 2

## 2017-01-21 MED ORDER — POTASSIUM CHLORIDE 10 MEQ/100ML IV SOLN
10.0000 meq | INTRAVENOUS | Status: AC
  Administered 2017-01-21 (×2): 10 meq via INTRAVENOUS
  Filled 2017-01-21 (×2): qty 100

## 2017-01-21 MED ORDER — KCL IN DEXTROSE-NACL 40-5-0.45 MEQ/L-%-% IV SOLN
INTRAVENOUS | Status: AC
  Administered 2017-01-21: 22:00:00 via INTRAVENOUS
  Filled 2017-01-21: qty 1000

## 2017-01-21 MED ORDER — MAGNESIUM SULFATE 2 GM/50ML IV SOLN
2.0000 g | Freq: Once | INTRAVENOUS | Status: AC
  Administered 2017-01-21: 2 g via INTRAVENOUS
  Filled 2017-01-21: qty 50

## 2017-01-21 MED ORDER — ENOXAPARIN SODIUM 40 MG/0.4ML ~~LOC~~ SOLN
40.0000 mg | SUBCUTANEOUS | Status: DC
  Administered 2017-01-21 – 2017-01-25 (×5): 40 mg via SUBCUTANEOUS
  Filled 2017-01-21 (×5): qty 0.4

## 2017-01-21 MED ORDER — PANTOPRAZOLE SODIUM 40 MG PO PACK
40.0000 mg | PACK | Freq: Every day | ORAL | Status: DC
  Administered 2017-01-21 – 2017-01-22 (×2): 40 mg
  Filled 2017-01-21 (×2): qty 20

## 2017-01-21 MED ORDER — IPRATROPIUM-ALBUTEROL 0.5-2.5 (3) MG/3ML IN SOLN
RESPIRATORY_TRACT | Status: AC
  Administered 2017-01-21
  Filled 2017-01-21: qty 3

## 2017-01-21 MED ORDER — CHLORHEXIDINE GLUCONATE 0.12% ORAL RINSE (MEDLINE KIT)
15.0000 mL | Freq: Two times a day (BID) | OROMUCOSAL | Status: DC
  Administered 2017-01-21 – 2017-01-22 (×2): 15 mL via OROMUCOSAL

## 2017-01-21 MED ORDER — ORAL CARE MOUTH RINSE
15.0000 mL | Freq: Four times a day (QID) | OROMUCOSAL | Status: DC
  Administered 2017-01-22 (×2): 15 mL via OROMUCOSAL

## 2017-01-21 NOTE — Procedures (Signed)
Intubation Procedure Note Taylor [name] [id number] 04/13/1961  Procedure: Intubation Indications: Airway protection and maintenance  Procedure Details Consent: Risks of procedure as well as the alternatives and risks of each were explained to the (patient/caregiver).  Consent for procedure obtained. Time Out: Verified patient identification, verified procedure, site/side was marked, verified correct patient position, special equipment/implants available, medications/allergies/relevent history reviewed, required imaging and test results available.  Performed  Maximum sterile technique was used including hand hygiene.  MAC and 4    Evaluation Hemodynamic Status: BP stable throughout; O2 sats: transiently fell during during procedure Patient's Current Condition: stable Complications: No apparent complications Patient did tolerate procedure well. Chest X-ray ordered to verify placement.  CXR: pending.   [name] 01/21/2017

## 2017-01-21 NOTE — Progress Notes (Signed)
Pts. height measured@ 72" with 8cc/kg IBW being 620 cc, transported uneventfully to ICU/stepdown 1231 with RN and family, RT to monitor.

## 2017-01-21 NOTE — ED Triage Notes (Signed)
Pt from home via EMS after using unknown drugs. EMS reports that pt was found with needle in pocket, diaphoretic and combative. Pt was given 5mg  IM haldol and versed for combative behavior. Pt arrives with shallow respirations and RR of approx 8. Pt had assisted breathing via bag mask with nasal trumpet in place. Dr Juleen Chinakohut at bedside and removed nasal trumpet.

## 2017-01-21 NOTE — H&P (Signed)
PULMONARY / CRITICAL CARE MEDICINE   Name: Taylor [name] MRN: [id number] DOB: May 30, 1961    ADMISSION DATE:  01/21/2017  CHIEF COMPLAINT:  AMS  HISTORY OF PRESENT ILLNESS:   HPI from fiance 2/2 patient intubated sedated.  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  Today she came home and found him unresponsive and called EMS. Patient was altered and combative requiring chemical restraint and eventual intubation for airway protection and to allow for adequate sedation.  PAST MEDICAL HISTORY :  He  has a past medical history of Back pain.  PAST SURGICAL HISTORY: He  has no past surgical history on file.  None per fiance  No Known Allergies  No current facility-administered medications on file prior to encounter.    Current Outpatient Prescriptions on File Prior to Encounter  Medication Sig  . cyclobenzaprine (FLEXERIL) 10 MG tablet Take 1 tablet (10 mg total) by mouth 3 (three) times daily as needed for muscle spasms.  . cyclobenzaprine (FLEXERIL) 10 MG tablet Take 10 mg by mouth 3 (three) times daily as needed for muscle spasms.  . meloxicam (MOBIC) 15 MG tablet Take 1 tablet (15 mg total) by mouth daily.  Marland Kitchen OVER THE COUNTER MEDICATION Apply 1 application topically daily as needed (itching). Over the counter cream for itching  Unknown if taking any Rx meds  FAMILY HISTORY:  His has no family status information on file.  Unable to obtain, fiance does not know  SOCIAL HISTORY: He  reports that he has quit smoking. He does not have any smokeless tobacco history on file. He reports that he drinks alcohol. He reports that he uses drugs, including Heroin.  REVIEW OF SYSTEMS:   Unable to obtain 2/2 intubated and sedated  SUBJECTIVE:  Unable to obtain 2/2 intubated and sedated  VITAL SIGNS: BP 133/87   Pulse 88   Resp 18   Ht 6\' 1"  (1.854 m)   Wt 160 lb (72.6 kg)   SpO2 100%   BMI 21.11 kg/m   HEMODYNAMICS:     VENTILATOR SETTINGS: FiO2 (%):  [100 %] 100 % Set Rate:  [18 bmp] 18 bmp Vt Set:  [782 mL] 620 mL PEEP:  [5 cmH20] 5 cmH20 Plateau Pressure:  [16 cmH20] 16 cmH20  INTAKE / OUTPUT: No intake/output data recorded.  PHYSICAL EXAMINATION: General:  Appears in good health younger than stated age Neuro:  Responsive to noxious HEENT:  ATNC, PERRLA, MOMM Cardiovascular:  RRR s1s2, no murmu, no cce, cap refill <2s Lungs:  CTA bilateral, no wheezing or rales, Abdomen:  +BS soft ntnd no hsm Musculoskeletal:  No red/warm/swollen/tender/deformity Skin:  Warm dry intact  LABS:  BMET  Recent Labs Lab 01/21/17 1743  NA 138  K 3.2*  CL 102  CO2 26  BUN 10  CREATININE 1.17  GLUCOSE 123*    Electrolytes  Recent Labs Lab 01/21/17 1743  CALCIUM 8.7*  MG 1.8    CBC  Recent Labs Lab 01/21/17 1743  WBC 6.5  HGB 11.1*  HCT 33.2*  PLT 203    Coag's No results for input(s): APTT, INR in the last 168 hours.  Sepsis Markers  Recent Labs Lab 01/21/17 1744  LATICACIDVEN 1.4    ABG  Recent Labs Lab 01/21/17 1903  PHART 7.452*  PCO2ART 35.3  PO2ART 447*    Liver Enzymes  Recent Labs Lab 01/21/17 1743  AST 66*  ALT 48  ALKPHOS 98  BILITOT 0.3  ALBUMIN 3.7    Cardiac Enzymes  Recent Labs Lab 01/21/17 1743  TROPONINI 0.06*    Glucose No results for input(s): GLUCAP in the last 168 hours.  Imaging Ct Head Wo Contrast  Result Date: 01/21/2017 CLINICAL DATA:  Drug overdose, agitation EXAM: CT HEAD WITHOUT CONTRAST TECHNIQUE: Contiguous axial images were obtained from the base of the skull through the vertex without intravenous contrast. COMPARISON:  MRI brain dated 02/22/2015 FINDINGS: Brain: No evidence of acute infarction, hemorrhage, hydrocephalus, extra-axial collection or mass lesion/mass effect. Vascular: No hyperdense vessel or unexpected calcification. Skull: Normal. Negative for fracture or focal lesion. Sinuses/Orbits: The visualized  paranasal sinuses are essentially clear. The mastoid air cells are unopacified. Other: None. IMPRESSION: Normal head CT. Electronically Signed   By: Charline Bills M.D.   On: 01/21/2017 18:47   Dg Chest Portable 1 View  Result Date: 01/21/2017 CLINICAL DATA:  Intubation EXAM: PORTABLE CHEST 1 VIEW COMPARISON:  Portable exam 1734 hours without priors for comparison. FINDINGS: Tip of endotracheal tube projects 4.8 cm above carina. Nasogastric tube extends into stomach. Normal heart size, mediastinal contours, and pulmonary vascularity. Minimal atelectasis or infiltrate in retrocardiac LEFT lower lobe. Remaining lungs clear. No pleural effusion or pneumothorax. Osseous structures unremarkable. IMPRESSION: Minimal atelectasis versus infiltrate in retrocardiac LEFT lower lobe. Electronically Signed   By: Ulyses Southward M.D.   On: 01/21/2017 17:49   Dg Abd Portable 1 View  Result Date: 01/21/2017 CLINICAL DATA:  Nasogastric tube placement EXAM: PORTABLE ABDOMEN - 1 VIEW COMPARISON:  Portable exam 1734 hours without priors for comparison. FINDINGS: Nasogastric tube in stomach with tip projecting over distal antrum. Visualized bowel gas pattern normal. Atelectasis versus infiltrate in retrocardiac LEFT lower lobe. RIGHT lung base clear. Osseous structures unremarkable. IMPRESSION: Tip of nasogastric tube projects over gastric antrum. Atelectasis versus infiltrate in retrocardiac LEFT lower lobe. Electronically Signed   By: Ulyses Southward M.D.   On: 01/21/2017 17:50     STUDIES:  EKG shows Sinus tachy, LAE, LVH, RsR' TWI V1-3 and min ST changes in V3-4. EKG is different than baseline  CULTURES: None  ANTIBIOTICS: None  SIGNIFICANT EVENTS: Intubated 7/21 >>  LINES/TUBES: ETT OG Foley  DISCUSSION: Suspect likely toxic ingestion and withdrawal leading to agitation and need for intubation for airway protection.  Elevated troponin likely Type 2 NSTEMI in setting of documented agiation and eelvated blood  pressures, cannot r/o Type 1 at present. Anemia needs w/u for GI source/IDA given low MCV.  Resp Alkalosis is iatrogenic.  ASSESSMENT / PLAN:  PULMONARY A: Intubate dairway protection P:   LTVV Repeat ABG CARDIOVASCULAR A:  Type 2 nstemi P:  Serial ekg and troponin Hold asa and heparin pending serial enzymes/ekgs  RENAL A:   hypokalemia P:   Replete, follow lytes  GASTROINTESTINAL A:   GI PPX P:   PPI  HEMATOLOGIC A:   Anemia P:  Ferritin, fobt  INFECTIOUS A:   Risk VAP P:   VAP PPX  ENDOCRINE A:   None   P:   Monitor glucose  NEUROLOGIC A:   Toxic encephalopathy P:   RASS goal: -1 F/u urne drug screen Fentanyl pain Propofol sedation   FAMILY  - Updates: fiance updated bedside  - Inter-disciplinary family meet or Palliative Care meeting due by:  day 7  45 minutes critical care time >22minutes direct face-to-face with patient and his surrogate bedside and coordination of care with RN, reviewing and ordering labs, reviewing CXR, ordering medications  Evans Lance, MD  Pulmonary and Critical Care Medicine [facility] Pager: [phone]  01/21/2017, 8:19 PM

## 2017-01-21 NOTE — ED Notes (Signed)
Dr Juleen ChinaKohut made aware of + Troponin 0.06

## 2017-01-21 NOTE — ED Notes (Signed)
Bed: RESA Expected date:  Expected time:  Means of arrival:  Comments: 

## 2017-01-21 NOTE — ED Provider Notes (Signed)
INTUBATION Performed by: Thermon LeylandHedges,Ezinne Yogi Todd  Required items: required blood products, implants, devices, and special equipment available Patient identity confirmed: provided demographic data and hospital-assigned identification number Time out: Immediately prior to procedure a "time out" was called to verify the correct patient, procedure, equipment, support staff and site/side marked as required.  Indications: overdose  Intubation method: Glidescope Laryngoscopy   Preoxygenation: BVM  Sedatives:  20 Etomidate Paralytic: 80 Succinylcholine  Tube Size: 8 cuffed  Post-procedure assessment: chest rise and ETCO2 monitor Breath sounds: equal and absent over the epigastrium Tube secured with: ETT holder Chest x-ray interpreted by radiologist and me.  Chest x-ray findings: endotracheal tube in appropriate position  Patient tolerated the procedure well with no immediate complications.     Eyvonne MechanicHedges, My Rinke, PA-C 01/21/17 1948    Raeford RazorKohut, Stephen, MD 01/29/17 [phone]

## 2017-01-21 NOTE — ED Notes (Signed)
Pt noted to be biting tube -propofol increased

## 2017-01-21 NOTE — ED Notes (Signed)
Pt found to need RSI per Dr Juleen ChinaKohut: 1727 Time out 1728 Etomidate 20 mg IV 1729 Roc 80 mg IV 1731 Pt intubated by Mamie LaurelJ, Hedges w/ Dr Juleen ChinaKohut @ bedside as well as RT 1731 + color change CO2- 24 @ lip, size 8 tube

## 2017-01-21 NOTE — ED Notes (Signed)
RSI being performed d/t breathing and agitation per Juleen ChinaKohut.

## 2017-01-21 NOTE — Progress Notes (Signed)
eLink Physician-Brief Progress Note Patient Name: Jake Sampleslbert Sneeringer DOB: 01/20/1961 MRN: [id number]   Date of Service  01/21/2017  HPI/Events of Note  Agitation - Request to renew Propofol order.  eICU Interventions  Will renew Propofol IV infusion order.      Intervention Category Minor Interventions: Agitation / anxiety - evaluation and management  Philana Younis Eugene 01/21/2017, 10:03 PM

## 2017-01-21 NOTE — ED Provider Notes (Signed)
[facility]-EMERGENCY DEPT Provider Note   CSN: [id number] Arrival date & time: 01/21/17  1659     History   Chief Complaint Chief Complaint  Patient presents with  . Drug Overdose    HPI Taylor [name] is a 56 y.o. male.  HPI   56yM with agitation. EMS called out to pt's residence by family for agitation/bizzarre behavior. Unknown when last seen normal. Reportedly history of drug abuse and EMS found a needle in his pocket. On their arrival he was crawling around on the ground. Diaphoretic. Combative. Wasn't speaking or following commands. Had to be given 5mg  haldol and 5mg  versed. EMS reports initially pupils 5-6 mm and symmetric. En route became apneic and pupils constricted. Glucose in 100s. No reported trauma. Medical history unknown. Arrived to ED with assisted ventilations.   Past Medical History:  Diagnosis Date  . Back pain     There are no active problems to display for this patient.   No past surgical history on file.     Home Medications    Prior to Admission medications   Medication Sig Start Date End Date Taking? Authorizing Provider  cyclobenzaprine (FLEXERIL) 10 MG tablet Take 1 tablet (10 mg total) by mouth 3 (three) times daily as needed for muscle spasms. 01/03/16   Charm Rings, MD  cyclobenzaprine (FLEXERIL) 10 MG tablet Take 10 mg by mouth 3 (three) times daily as needed for muscle spasms.    [provider]  meloxicam (MOBIC) 15 MG tablet Take 1 tablet (15 mg total) by mouth daily. 01/03/16   Charm Rings, MD  OVER THE COUNTER MEDICATION Apply 1 application topically daily as needed (itching). Over the counter cream for itching    [provider]    Family History No family history on file.  Social History Social History  Substance Use Topics  . Smoking status: Former Games developer  . Smokeless tobacco: Not on file  . Alcohol use Yes     Comment: social     Allergies   Patient has no known allergies.   Review of Systems Review of  Systems  Level 5 caveat because pt nonverbal.  Physical Exam Updated Vital Signs BP (!) 137/103 (BP Location: Right Arm)   Pulse (!) 102   Resp 10   SpO2 100%   Physical Exam  Constitutional: He appears distressed.  HENT:  Head: Normocephalic.  Mouth/Throat: Oropharynx is clear and moist.  Superficial abrasions to forehead  Eyes: Conjunctivae are normal. Right eye exhibits no discharge. Left eye exhibits no discharge.  Pupils pinpoint  Neck: Neck supple.  Cardiovascular: Regular rhythm and normal heart sounds.  Exam reveals no gallop and no friction rub.   No murmur heard. tachycardic  Pulmonary/Chest: Breath sounds normal.  Irregular shallow respirations with rate around 6.  Abdominal: Soft. He exhibits no distension.  Genitourinary: Penis normal.  Musculoskeletal: He exhibits no edema.  Scattered abrasions to b/l forearms.   Neurological: GCS eye subscore is 1. GCS verbal subscore is 1. GCS motor subscore is 4.  Not following commands. Withdrawing all extremities to pain. Muscle tone seems normal.   Skin: Skin is warm and dry.  Nursing note and vitals reviewed.    ED Treatments / Results  Labs (all labs ordered are listed, but only abnormal results are displayed) Labs Reviewed  CBC WITH DIFFERENTIAL/PLATELET  COMPREHENSIVE METABOLIC PANEL  URINALYSIS, ROUTINE W REFLEX MICROSCOPIC  RAPID URINE DRUG SCREEN, HOSP PERFORMED  ETHANOL  CK  LACTIC ACID, PLASMA  LACTIC  ACID, PLASMA  TROPONIN I  MAGNESIUM  BLOOD GAS, ARTERIAL  SALICYLATE LEVEL  ACETAMINOPHEN LEVEL    EKG  EKG Interpretation None       Radiology Dg Chest Portable 1 View  Result Date: 01/21/2017 CLINICAL DATA:  Intubation EXAM: PORTABLE CHEST 1 VIEW COMPARISON:  Portable exam 1734 hours without priors for comparison. FINDINGS: Tip of endotracheal tube projects 4.8 cm above carina. Nasogastric tube extends into stomach. Normal heart size, mediastinal contours, and pulmonary vascularity. Minimal  atelectasis or infiltrate in retrocardiac LEFT lower lobe. Remaining lungs clear. No pleural effusion or pneumothorax. Osseous structures unremarkable. IMPRESSION: Minimal atelectasis versus infiltrate in retrocardiac LEFT lower lobe. Electronically Signed   By: Ulyses SouthwardMark  Boles M.D.   On: 01/21/2017 17:49   Dg Abd Portable 1 View  Result Date: 01/21/2017 CLINICAL DATA:  Nasogastric tube placement EXAM: PORTABLE ABDOMEN - 1 VIEW COMPARISON:  Portable exam 1734 hours without priors for comparison. FINDINGS: Nasogastric tube in stomach with tip projecting over distal antrum. Visualized bowel gas pattern normal. Atelectasis versus infiltrate in retrocardiac LEFT lower lobe. RIGHT lung base clear. Osseous structures unremarkable. IMPRESSION: Tip of nasogastric tube projects over gastric antrum. Atelectasis versus infiltrate in retrocardiac LEFT lower lobe. Electronically Signed   By: Ulyses SouthwardMark  Boles M.D.   On: 01/21/2017 17:50    Procedures Procedures (including critical care time)  Angiocath insertion Performed by: Raeford RazorKOHUT, Tamu Golz  Consent: Verbal consent obtained. Risks and benefits: risks, benefits and alternatives were discussed Time out: Immediately prior to procedure a "time out" was called to verify the correct patient, procedure, equipment, support staff and site/side marked as required.  Preparation: Patient was prepped and draped in the usual sterile fashion.  Vein Location: R EJ  Gauge: 18g  Normal blood return and flush without difficulty Patient tolerance: Patient tolerated the procedure well with no immediate complications.   CRITICAL CARE Performed by: Raeford RazorKOHUT, Analayah Brooke Total critical care time: 60 minutes Critical care time was exclusive of separately billable procedures and treating other patients. Critical care was necessary to treat or prevent imminent or life-threatening deterioration. Critical care was time spent personally by me on the following activities: development of  treatment plan with patient and/or surrogate as well as nursing, discussions with consultants, evaluation of patient's response to treatment, examination of patient, obtaining history from patient or surrogate, ordering and performing treatments and interventions, ordering and review of laboratory studies, ordering and review of radiographic studies, pulse oximetry and re-evaluation of patient's condition.   Medications Ordered in ED Medications  sodium chloride 0.9 % bolus 1,000 mL (not administered)  propofol (DIPRIVAN) 1000 MG/100ML infusion (not administered)  naloxone (NARCAN) injection 0.2 mg (0.2 mg Intravenous Given 01/21/17 1710)     Initial Impression / Assessment and Plan / ED Course  I have reviewed the triage vital signs and the nursing notes.  Pertinent labs & imaging results that were available during my care of the patient were reviewed by me and considered in my medical decision making (see chart for details).     On arrival to the ED the pt had a GCS of 6. RR of ~6. HR 110s and sinus. Normotensive. We continued to bag him as we established additional IV access, obtained EKG and labs. We then gave narcan. Pupillary response and moving all extremities but no clearly purposeful and remained nonverbal/not following commands. Intubated for airway protection with anticipated need of further sedation.   Likely drug related. EMS describes what sounds like excited delirium on their initial assessment.  Some superficial abrasions on face. No reported trauma, but with unreliable exam will need CT head in addition to other work-up. Continue supportive measures and sedation PRN. Discussed with CCM.   Final Clinical Impressions(s) / ED Diagnoses   Final diagnoses:  Drug overdose, undetermined intent, initial encounter  Delirium    New Prescriptions New Prescriptions   No medications on file     Raeford Razor, MD 02/05/17 [phone]

## 2017-01-22 ENCOUNTER — Encounter (HOSPITAL_COMMUNITY): Payer: Self-pay

## 2017-01-22 ENCOUNTER — Inpatient Hospital Stay (HOSPITAL_COMMUNITY): Payer: Self-pay

## 2017-01-22 DIAGNOSIS — D509 Iron deficiency anemia, unspecified: Secondary | ICD-10-CM

## 2017-01-22 DIAGNOSIS — E873 Alkalosis: Secondary | ICD-10-CM

## 2017-01-22 DIAGNOSIS — G92 Toxic encephalopathy: Principal | ICD-10-CM

## 2017-01-22 LAB — BASIC METABOLIC PANEL
Anion gap: 7 (ref 5–15)
BUN: 8 mg/dL (ref 6–20)
CHLORIDE: 110 mmol/L (ref 101–111)
CO2: 24 mmol/L (ref 22–32)
Calcium: 8.8 mg/dL — ABNORMAL LOW (ref 8.9–10.3)
Creatinine, Ser: 1 mg/dL (ref 0.61–1.24)
GFR calc Af Amer: >60 mL/min (ref >60)
GFR calc non Af Amer: >60 mL/min (ref >60)
GLUCOSE: 107 mg/dL — AB (ref 65–99)
POTASSIUM: 3.4 mmol/L — AB (ref 3.5–5.1)
Sodium: 141 mmol/L (ref 135–145)

## 2017-01-22 LAB — PHOSPHORUS: Phosphorus: 2.2 mg/dL — ABNORMAL LOW (ref 2.5–4.6)

## 2017-01-22 LAB — CBC
HCT: 33.2 % — ABNORMAL LOW (ref 39.0–52.0)
HEMOGLOBIN: 11 g/dL — AB (ref 13.0–17.0)
MCH: 21.4 pg — AB (ref 26.0–34.0)
MCHC: 33.1 g/dL (ref 30.0–36.0)
MCV: 64.6 fL — AB (ref 78.0–100.0)
PLATELETS: 211 10*3/uL (ref 150–400)
RBC: 5.14 MIL/uL (ref 4.22–5.81)
RDW: 16 % — ABNORMAL HIGH (ref 11.5–15.5)
WBC: 4.2 10*3/uL (ref 4.0–10.5)

## 2017-01-22 LAB — MAGNESIUM: Magnesium: 2.5 mg/dL — ABNORMAL HIGH (ref 1.7–2.4)

## 2017-01-22 LAB — BLOOD GAS, ARTERIAL
ACID-BASE EXCESS: 2.7 mmol/L — AB (ref 0.0–2.0)
Bicarbonate: 23.6 mmol/L (ref 20.0–28.0)
Drawn by: 11249
FIO2: 50
LHR: 18 {breaths}/min
O2 SAT: 99.5 %
PATIENT TEMPERATURE: 37
PCO2 ART: 24.5 mmHg — AB (ref 32.0–48.0)
PH ART: 7.59 — AB (ref 7.350–7.450)
PO2 ART: 198 mmHg — AB (ref 83.0–108.0)
VT: 620 mL

## 2017-01-22 LAB — ECHOCARDIOGRAM COMPLETE
HEIGHTINCHES: 73 in
Weight: 2257.51 oz

## 2017-01-22 LAB — MRSA PCR SCREENING: MRSA by PCR: NEGATIVE

## 2017-01-22 LAB — FERRITIN: FERRITIN: 73 ng/mL (ref 24–336)

## 2017-01-22 LAB — TROPONIN I: Troponin I: 0.05 ng/mL (ref <0.03)

## 2017-01-22 MED ORDER — ORAL CARE MOUTH RINSE
15.0000 mL | Freq: Two times a day (BID) | OROMUCOSAL | Status: DC
  Administered 2017-01-22 – 2017-01-26 (×3): 15 mL via OROMUCOSAL

## 2017-01-22 MED ORDER — ASPIRIN 300 MG RE SUPP
300.0000 mg | Freq: Every day | RECTAL | Status: DC
  Administered 2017-01-22: 300 mg via RECTAL
  Filled 2017-01-22 (×2): qty 1

## 2017-01-22 MED ORDER — METOPROLOL TARTRATE 5 MG/5ML IV SOLN
5.0000 mg | Freq: Two times a day (BID) | INTRAVENOUS | Status: DC
  Administered 2017-01-22 – 2017-01-23 (×3): 5 mg via INTRAVENOUS
  Filled 2017-01-22 (×5): qty 5

## 2017-01-22 MED ORDER — POTASSIUM PHOSPHATES 15 MMOLE/5ML IV SOLN
30.0000 mmol | Freq: Once | INTRAVENOUS | Status: AC
  Administered 2017-01-22: 30 mmol via INTRAVENOUS
  Filled 2017-01-22: qty 10

## 2017-01-22 MED ORDER — HYDRALAZINE HCL 20 MG/ML IJ SOLN
10.0000 mg | INTRAMUSCULAR | Status: DC | PRN
  Administered 2017-01-22: 20 mg via INTRAVENOUS
  Filled 2017-01-22: qty 1
  Filled 2017-01-22: qty 2

## 2017-01-22 MED ORDER — CHLORHEXIDINE GLUCONATE 0.12 % MT SOLN
15.0000 mL | Freq: Two times a day (BID) | OROMUCOSAL | Status: DC
  Administered 2017-01-23 – 2017-01-26 (×6): 15 mL via OROMUCOSAL
  Filled 2017-01-22 (×6): qty 15

## 2017-01-22 MED ORDER — ASPIRIN 325 MG PO TABS
325.0000 mg | ORAL_TABLET | Freq: Every day | ORAL | Status: DC
  Administered 2017-01-23 – 2017-01-26 (×3): 325 mg via ORAL
  Filled 2017-01-22 (×3): qty 1

## 2017-01-22 MED ORDER — FENTANYL CITRATE (PF) 100 MCG/2ML IJ SOLN
12.5000 ug | INTRAMUSCULAR | Status: DC | PRN
  Administered 2017-01-22 – 2017-01-24 (×9): 25 ug via INTRAVENOUS
  Filled 2017-01-22 (×10): qty 2

## 2017-01-22 NOTE — Progress Notes (Signed)
*  PRELIMINARY RESULTS* Echocardiogram 2D Echocardiogram has been performed.  Jeryl Columbialliott, Tamyrah Burbage 01/22/2017, 2:54 PM

## 2017-01-22 NOTE — Progress Notes (Signed)
PULMONARY / CRITICAL CARE MEDICINE   Name: Taylor [name] MRN: [id number] DOB: 05/14/1961    ADMISSION DATE:  01/21/2017  CHIEF COMPLAINT:  AMS  BRIEF:   56 y/o male found confused and combative on the afternoon of 7/21.  He has a history of heroin abuse.  Intubated, PCCM asked to admit.   SUBJECTIVE:  Troponin slightly elevated Profoundly hypertensive overnight   VITAL SIGNS: BP (!) 164/111   Pulse 75   Temp 98.8 F (37.1 C) (Axillary)   Resp 16   Ht 6\' 1"  (1.854 m)   Wt 64 kg (141 lb 1.5 oz)   SpO2 100%   BMI 18.62 kg/m   HEMODYNAMICS:    VENTILATOR SETTINGS: Vent Mode: PRVC FiO2 (%):  [40 %-100 %] 40 % Set Rate:  [16 bmp-18 bmp] 16 bmp Vt Set:  [550 mL-620 mL] 550 mL PEEP:  [5 cmH20] 5 cmH20 Plateau Pressure:  [16 cmH20-23 cmH20] 23 cmH20  INTAKE / OUTPUT: I/O last 3 completed shifts: In: 1399.4 [I.V.:639.4; IV Piggyback:760] Out: 2300 [Urine:2300]  PHYSICAL EXAMINATION:  General:  In bed on vent HENT: NCAT ETT in place PULM: CTA B, vent supported breathing CV: RRR, no mgr GI: BS+, soft, nontender MSK: normal bulk and tone Neuro: sedated on vent    LABS:  BMET  Recent Labs Lab 01/21/17 1743 01/22/17 0352  NA 138 141  K 3.2* 3.4*  CL 102 110  CO2 26 24  BUN 10 8  CREATININE 1.17 1.00  GLUCOSE 123* 107*    Electrolytes  Recent Labs Lab 01/21/17 1743 01/22/17 0352  CALCIUM 8.7* 8.8*  MG 1.8 2.5*  PHOS  --  2.2*    CBC  Recent Labs Lab 01/21/17 1743 01/22/17 0352  WBC 6.5 4.2  HGB 11.1* 11.0*  HCT 33.2* 33.2*  PLT 203 211    Coag's No results for input(s): APTT, INR in the last 168 hours.  Sepsis Markers  Recent Labs Lab 01/21/17 1744  LATICACIDVEN 1.4    ABG  Recent Labs Lab 01/21/17 1903 01/21/17 2130  PHART 7.452* 7.590*  PCO2ART 35.3 24.5*  PO2ART 447* 198*    Liver Enzymes  Recent Labs Lab 01/21/17 1743  AST 66*  ALT 48  ALKPHOS 98  BILITOT 0.3  ALBUMIN 3.7    Cardiac Enzymes  Recent  Labs Lab 01/21/17 2102 01/21/17 2159 01/22/17 0352  TROPONINI 0.11* 0.10* 0.05*    Glucose No results for input(s): GLUCAP in the last 168 hours.  Imaging Ct Head Wo Contrast  Result Date: 01/21/2017 CLINICAL DATA:  Drug overdose, agitation EXAM: CT HEAD WITHOUT CONTRAST TECHNIQUE: Contiguous axial images were obtained from the base of the skull through the vertex without intravenous contrast. COMPARISON:  MRI brain dated 02/22/2015 FINDINGS: Brain: No evidence of acute infarction, hemorrhage, hydrocephalus, extra-axial collection or mass lesion/mass effect. Vascular: No hyperdense vessel or unexpected calcification. Skull: Normal. Negative for fracture or focal lesion. Sinuses/Orbits: The visualized paranasal sinuses are essentially clear. The mastoid air cells are unopacified. Other: None. IMPRESSION: Normal head CT. Electronically Signed   By: Charline Bills M.D.   On: 01/21/2017 18:47   Dg Chest Portable 1 View  Result Date: 01/21/2017 CLINICAL DATA:  Intubation EXAM: PORTABLE CHEST 1 VIEW COMPARISON:  Portable exam 1734 hours without priors for comparison. FINDINGS: Tip of endotracheal tube projects 4.8 cm above carina. Nasogastric tube extends into stomach. Normal heart size, mediastinal contours, and pulmonary vascularity. Minimal atelectasis or infiltrate in retrocardiac LEFT lower lobe. Remaining lungs clear.  No pleural effusion or pneumothorax. Osseous structures unremarkable. IMPRESSION: Minimal atelectasis versus infiltrate in retrocardiac LEFT lower lobe. Electronically Signed   By: Ulyses SouthwardMark  Boles M.D.   On: 01/21/2017 17:49   Dg Abd Portable 1 View  Result Date: 01/21/2017 CLINICAL DATA:  Nasogastric tube placement EXAM: PORTABLE ABDOMEN - 1 VIEW COMPARISON:  Portable exam 1734 hours without priors for comparison. FINDINGS: Nasogastric tube in stomach with tip projecting over distal antrum. Visualized bowel gas pattern normal. Atelectasis versus infiltrate in retrocardiac LEFT  lower lobe. RIGHT lung base clear. Osseous structures unremarkable. IMPRESSION: Tip of nasogastric tube projects over gastric antrum. Atelectasis versus infiltrate in retrocardiac LEFT lower lobe. Electronically Signed   By: Ulyses SouthwardMark  Boles M.D.   On: 01/21/2017 17:50     STUDIES:  EKG shows Sinus tachy, LAE, LVH, RsR' TWI V1-3 and min ST changes in V3-4. EKG is different than baseline  CULTURES: none  ANTIBIOTICS: None  SIGNIFICANT EVENTS: Intubated 7/21 >>  LINES/TUBES: ETT 7/21 OG Foley  DISCUSSION: Mr. Tresa EndoKelly has toxic metabolic encephalopathy from narcotic overdose which appears to be slowly improving.  He has remained on sedation overnight but on my exam this morning seems to be waking up.  If he will continue to wake up we can extubate him.  He also has hypertensive emergency and demand ischemia (No STEMI seen on this morning's EKG, only repolarization abnormalities).  ASSESSMENT / PLAN:  PULMONARY A: Inability to protect airway P:   Full mechanical vent support VAP prevention Daily WUA/SBT Extubate today if awake enough  CARDIOVASCULAR A:  Demand ischemia Hypertensive urgency P:  Tele Asa Metoprolol scheduled Hydralazine prn Order echo Will need continued   RENAL A:   Hypokalemia Hypophosphatemia P:   Monitor BMET and UOP Replace potassium and phosphorous now  GASTROINTESTINAL A:   GI PPX P:   PPI Advance diet after extubation  HEMATOLOGIC A:   Anemia without bleeding P:  Monitor for bleeding  INFECTIOUS A:   Risk VAP P:   Monitor for signs of infection  ENDOCRINE A:   None   P:   Monitor glucose  NEUROLOGIC A:   Toxic metabolic encephalopathy due to hypercarbia Narcotic overdose Heroin abuse P:   Stop sedation protocol now   FAMILY  - Updates: fiance updated bedside by me 7/22  - Inter-disciplinary family meet or Palliative Care meeting due by:  day 7  My ccm time 33 minutes  Heber CarolinaBrent Unice Vantassel, MD [facility] Pager:  [phone] Cell: [phone] After 3pm or if no response, call [phone]   01/22/2017, 11:21 AM

## 2017-01-22 NOTE — Progress Notes (Signed)
eLink Physician-Brief Progress Note Patient Name: Taylor [name] DOB: 02/26/1961 MRN: [id number]   Date of Service  01/22/2017  HPI/Events of Note  Mild trop elevation and EKG changes showing mild ST elevation in lateral leads  eICU Interventions  Plan: Continue to cycle trop ASA/BB On Lovenox already Consider ECHO/Cards         Lanaya Bennis 01/22/2017, 5:30 AM

## 2017-01-22 NOTE — Progress Notes (Signed)
Pt EKG showed STEMI. Notified ELink, received orders from Dr. Darrick Pennaeterding to continue to cycle troponins, added aspirin and metoprolol IV. Did not activate code STEMI at this time. Cardiology consulted. Pt VSS, no acute changes since last assessment. Sitter at bedside. Will continue to monitor.

## 2017-01-22 NOTE — Progress Notes (Signed)
eLink Physician-Brief Progress Note Patient Name: Taylor [name] DOB: 04/28/1961 MRN: [id number]   Date of Service  01/22/2017  HPI/Events of Note  L shoulder pain p fell prior to admit Not better with fentanyl but pt IV drug user  eICU Interventions  Should series/ ortho eval prn Minimize fentanyl     Intervention Category Minor Interventions: Clinical assessment - ordering diagnostic tests  Sandrea HughsMichael Sandi Towe 01/22/2017, 7:45 PM

## 2017-01-22 NOTE — Evaluation (Signed)
Clinical/Bedside Swallow Evaluation Patient Details  Name: Taylor [name] MRN: [id number] Date of Birth: 04/20/1961  Today's Date: 01/22/2017 Time: SLP Start Time (ACUTE ONLY): 1520 SLP Stop Time (ACUTE ONLY): 1539 SLP Time Calculation (min) (ACUTE ONLY): 19 min  Past Medical History:  Past Medical History:  Diagnosis Date  . Back pain    Past Surgical History: No past surgical history on file. HPI:  56 y/o male found confused and combative on the afternoon of 7/21. He has a history of heroin abuse. ETT 7/21-7/22. Now with toxic metabolic encephalopathy from narcotic overdose which appears to be slowly improving   Assessment / Plan / Recommendation Clinical Impression  Pt presents with functional swallow after short-term intubation - he is confused, mildly agitated and difficult to redirect - but able to participate in assessment.  Presented with adequate mastication, brisk swallow response.  Initial mild throat-clearing/cough which ceased by end of session.  Good respiratory/swallow coordination, mildly hoarse voice - doubt aspirating.  Recommend initiating a regular diet, thin liquids.  No SLP f/u warranted.   SLP Visit Diagnosis: Dysphagia, unspecified (R13.10)    Aspiration Risk  No limitations    Diet Recommendation   regular solids, thin liquids  Medication Administration: Whole meds with liquid    Other  Recommendations Oral Care Recommendations: Oral care BID   Follow up Recommendations None      Frequency and Duration            Prognosis        Swallow Study   General Date of Onset: 01/21/17 HPI: 56 y/o male found confused and combative on the afternoon of 7/21. He has a history of heroin abuse. ETT 7/21-7/22. Now with toxic metabolic encephalopathy from narcotic overdose which appears to be slowly improving Type of Study: Bedside Swallow Evaluation Previous Swallow Assessment: no Diet Prior to this Study: NPO Temperature Spikes Noted: No Respiratory  Status: Room air History of Recent Intubation: Yes Length of Intubations (days): 1 days Date extubated: 01/22/17 Behavior/Cognition: Alert;Confused;Agitated Oral Cavity Assessment: Within Functional Limits Oral Care Completed by SLP: No Oral Cavity - Dentition: Adequate natural dentition Vision: Functional for self-feeding Self-Feeding Abilities: Able to feed self Patient Positioning: Upright in bed Baseline Vocal Quality: Normal Volitional Cough: Strong Volitional Swallow: Able to elicit    Oral/Motor/Sensory Function Overall Oral Motor/Sensory Function: Within functional limits   Ice Chips Ice chips: Within functional limits   Thin Liquid Thin Liquid: Within functional limits    Nectar Thick Nectar Thick Liquid: Not tested   Honey Thick Honey Thick Liquid: Not tested   Puree Puree: Within functional limits   Solid   GO   Solid: Within functional limits        Taylor [name], Taylor [name] Taylor [name] 01/22/2017,3:40 PM

## 2017-01-22 NOTE — Procedures (Signed)
Extubation Procedure Note  Patient Details:   Name: Taylor [name] DOB: 10/05/1960 MRN: [id number]   Airway Documentation:     Evaluation  O2 sats: stable throughout Complications: No apparent complications  Patient did tolerate procedure well. Bilateral Breath Sounds: Clear   Yes   Patient extubated to room air. Vital signs stable at this time. No complications. RN at bedside. RT will continue to monitor.   Ave Filterdkins, Carolos Fecher Williams 01/22/2017, 11:55 AM

## 2017-01-22 NOTE — Progress Notes (Signed)
eLink Physician-Brief Progress Note Patient Name: Taylor [name] DOB: 03/12/1961 MRN: [id number]   Date of Service  01/22/2017  HPI/Events of Note  Hypokalemia and hypophos  eICU Interventions  Potassium and phos replaced     Intervention Category Intermediate Interventions: Electrolyte abnormality - evaluation and management  DETERDING,ELIZABETH 01/22/2017, 4:59 AM

## 2017-01-23 DIAGNOSIS — F1114 Opioid abuse with opioid-induced mood disorder: Secondary | ICD-10-CM | POA: Diagnosis present

## 2017-01-23 DIAGNOSIS — G9340 Encephalopathy, unspecified: Secondary | ICD-10-CM

## 2017-01-23 DIAGNOSIS — I330 Acute and subacute infective endocarditis: Secondary | ICD-10-CM

## 2017-01-23 DIAGNOSIS — Z87891 Personal history of nicotine dependence: Secondary | ICD-10-CM

## 2017-01-23 DIAGNOSIS — R4182 Altered mental status, unspecified: Secondary | ICD-10-CM

## 2017-01-23 MED ORDER — ALPRAZOLAM 1 MG PO TABS
1.0000 mg | ORAL_TABLET | Freq: Once | ORAL | Status: AC
  Administered 2017-01-23: 1 mg via ORAL
  Filled 2017-01-23: qty 1

## 2017-01-23 MED ORDER — HYDROCODONE-ACETAMINOPHEN 7.5-325 MG PO TABS
1.0000 | ORAL_TABLET | Freq: Four times a day (QID) | ORAL | Status: DC | PRN
  Administered 2017-01-23 – 2017-01-26 (×8): 1 via ORAL
  Filled 2017-01-23 (×8): qty 1

## 2017-01-23 MED ORDER — DEXTROSE 5 % IV SOLN
2.0000 g | INTRAVENOUS | Status: DC
  Administered 2017-01-23: 2 g via INTRAVENOUS
  Filled 2017-01-23 (×2): qty 2

## 2017-01-23 MED ORDER — IPRATROPIUM-ALBUTEROL 0.5-2.5 (3) MG/3ML IN SOLN: 3.0000 mL | Freq: Four times a day (QID) | RESPIRATORY_TRACT | Status: DC | PRN

## 2017-01-23 MED ORDER — SODIUM CHLORIDE 0.9 % IV SOLN
INTRAVENOUS | Status: DC
  Administered 2017-01-23: 17:00:00 via INTRAVENOUS

## 2017-01-23 MED ORDER — PANTOPRAZOLE SODIUM 40 MG PO TBEC: 40.0000 mg | DELAYED_RELEASE_TABLET | Freq: Every day | ORAL | Status: DC

## 2017-01-23 MED ORDER — VANCOMYCIN HCL IN DEXTROSE 750-5 MG/150ML-% IV SOLN
750.0000 mg | Freq: Two times a day (BID) | INTRAVENOUS | Status: DC
  Administered 2017-01-23 – 2017-01-24 (×2): 750 mg via INTRAVENOUS
  Filled 2017-01-23 (×3): qty 150

## 2017-01-23 NOTE — Progress Notes (Signed)
PULMONARY / CRITICAL CARE MEDICINE   Name: Taylor [name] MRN: [id number] DOB: June 04, 1961    ADMISSION DATE:  01/21/2017  CHIEF COMPLAINT:  AMS  BRIEF SUMMARY:   56 y/o male found confused and combative on the afternoon of 7/21.  He has a history of heroin abuse.  Intubated, PCCM asked to admit.  Concern for demand ischemia during admit and hypertension.    SUBJECTIVE:  No acute events.  Extubated 7/22, tolerated well.  On RA.  Pt reports neck / shoulder pain.  Pt denies intent for self harm prior to admit.  States "I got some bad stuff".    VITAL SIGNS: BP (!) 146/92   Pulse 70   Temp 99 F (37.2 C) (Oral)   Resp 19   Ht 6\' 1"  (1.854 m)   Wt 141 lb 1.5 oz (64 kg)   SpO2 100%   BMI 18.62 kg/m   HEMODYNAMICS:    VENTILATOR SETTINGS:    INTAKE / OUTPUT: I/O last 3 completed shifts: In: 1460 [I.V.:700; IV Piggyback:760] Out: 5210 [Urine:4510; Emesis/NG output:700]  PHYSICAL EXAMINATION: General: thin adult male in NAD, lying in bed   HEENT: MM pink/moist, no jvd, good dentition  PSY: impulsive behavior, minimizing symptoms of drug abuse "I just got back on it"   Neuro: Awake, alert, MAE, speech clear CV: s1s2 rrr, ? Soft murmur PULM: even/non-labored, lungs bilaterally clear  NW:GNFA, non-tender, bsx4 active  Extremities: warm/dry, no edema  Skin: no rashes / lesions, multiple tattoos   LABS:  BMET  Recent Labs Lab 01/21/17 1743 01/22/17 0352  NA 138 141  K 3.2* 3.4*  CL 102 110  CO2 26 24  BUN 10 8  CREATININE 1.17 1.00  GLUCOSE 123* 107*    Electrolytes  Recent Labs Lab 01/21/17 1743 01/22/17 0352  CALCIUM 8.7* 8.8*  MG 1.8 2.5*  PHOS  --  2.2*    CBC  Recent Labs Lab 01/21/17 1743 01/22/17 0352  WBC 6.5 4.2  HGB 11.1* 11.0*  HCT 33.2* 33.2*  PLT 203 211    Coag's No results for input(s): APTT, INR in the last 168 hours.  Sepsis Markers  Recent Labs Lab 01/21/17 1744  LATICACIDVEN 1.4    ABG  Recent Labs Lab  01/21/17 1903 01/21/17 2130  PHART 7.452* 7.590*  PCO2ART 35.3 24.5*  PO2ART 447* 198*    Liver Enzymes  Recent Labs Lab 01/21/17 1743  AST 66*  ALT 48  ALKPHOS 98  BILITOT 0.3  ALBUMIN 3.7    Cardiac Enzymes  Recent Labs Lab 01/21/17 2102 01/21/17 2159 01/22/17 0352  TROPONINI 0.11* 0.10* 0.05*    Glucose No results for input(s): GLUCAP in the last 168 hours.  Imaging Dg Shoulder Left  Result Date: 01/22/2017 CLINICAL DATA:  Left shoulder pain after fall. EXAM: LEFT SHOULDER - 2+ VIEW COMPARISON:  None. FINDINGS: There is no evidence of fracture or dislocation. Moderate acromioclavicular osteoarthritis. Possible glenohumeral osteoarthritis. Soft tissues are unremarkable. IMPRESSION: Degenerative changes of the acromioclavicular and possibly glenohumeral joints. No fracture or dislocation. Electronically Signed   By: Rubye Oaks M.D.   On: 01/22/2017 20:43     STUDIES:  EKG 7/22 >> ST, LAE, LVH, RsR' TWI V1-3 and min ST changes in V3-4. EKG is different than baseline ECHO 7/22 >> LV normal size, LVEF 60-65%, no RWMA, TV area of ECHO density underneath the medial leaflet concerning for vegetation, may need TEE if clinical suspicion for endocarditis L Shoulder XR 7/22 >> degenerative changes  of the acromioclavicluar & possibly the glenohumeral joints, no fx or dislocation  CULTURES: BCx2 7/23 >>   ANTIBIOTICS: Vanco (empiric endocarditis) 7/23 >> Ceftriaxone (empic endocarditis) 7/23 >>   SIGNIFICANT EVENTS: 7/21  Admit, intubated 7/22  Extubated   LINES/TUBES: ETT 7/21 >> 7/22  DISCUSSION: Mr. Tresa EndoKelly has toxic metabolic encephalopathy from narcotic overdose which appears to be slowly improving.  He initially was on sedation but woke on am 7/22.  Course complicated by hypertensive emergency and demand ischemia.  ECHO returned with ? TV vegetation.   ASSESSMENT / PLAN:  PULMONARY A: Inability to protect airway - in setting of overdose, resolved P:    Pulmonary hygiene - IS, mobilize  Intermittent CXR Adjust nebulizer medications to Q6 PRN   CARDIOVASCULAR A:  Demand ischemia Hypertensive urgency R/O Endocarditis  P:  Telemetry monitoring  ASA Metoprolol 5mg  Q12 PRN hydralazine  ECHO as above with concern for possible vegetation on TV Requested Cardiology evaluation for TEE See ID  RENAL A:   Hypokalemia Hypophosphatemia P:   Trend BMP / urinary output Replace electrolytes as indicated Avoid nephrotoxic agents, ensure adequate renal perfusion  GASTROINTESTINAL A:   GI PPX P:   Diet as tolerated   HEMATOLOGIC A:   Anemia without bleeding P:  Trend CBC   INFECTIOUS A:   Rule Out Endocarditis  P:   Assess BC given concern for possible vegetation   Begin empiric vanco / rocephin after blood cultures   ENDOCRINE A:   None   P:   Trend glucose on BMP   NEUROLOGIC A:   Toxic metabolic encephalopathy due to hypercarbia Narcotic overdose Heroin abuse P:   Supportive care  Minimize sedating medications PSY consulted for overdose, substance abuse     FAMILY  - Updates: patient updated on plan of care 7/23.  Will ask Cardiology to review for possible TEE and PSY given overdose.  Transfer to tele floor, TRH as of 7/24 am.     - Inter-disciplinary family meet or Palliative Care meeting due by:  day 7   Canary BrimBrandi Alwilda Gilland, NP-C [facility] Pgr: [phone] or if no answer [phone] 01/23/2017, 9:30 AM

## 2017-01-23 NOTE — Consult Note (Signed)
[facility] Face-to-Face Psychiatry Consult   Reason for Consult:  AMS and intoxication with opioid and benzo's Referring Physician:  Dr. Margaretmary Eddy, NP Patient Identification: Taylor [name] MRN:  [id number] Principal Diagnosis: <principal problem not specified> Diagnosis:   Patient Active Problem List   Diagnosis Date Noted  . Encephalopathy, toxic [G92] 01/21/2017  . Microcytic anemia [D50.9] 01/21/2017  . Acute respiratory alkalosis [E87.3] 01/21/2017  . Elevated troponin [R74.8] 01/21/2017  . Toxic encephalopathy [G92] 01/21/2017    Total Time spent with patient: 45 minutes  Subjective:   Taylor [name] is a 56 y.o. male patient admitted with Altered mental status.  HPI: Taylor [name] is a 56 years old male admitted to [facility] with altered mental status required intubation while in the emergency department. Patient is currently extubated and able to communicate well during this evaluation. Patient is seen along with unit social service. Patient wife is at bedside. Patient reported has a history of opioid dependence since he was a teenage years and recently relapsed about 5 months ago. Patient stated he quit using opiates since the episodes of trashing himself under the influence. Patient also reported his wife has been unhappy, crying and Physical he stopping him to use his drug of abuse. Patient reportedly using heroin and fentanyl by needles. She minimizes his abuse by saying he uses every 2 days only. Patient also reported he has been working and taking responsibility for his house and wife. Patient has children and grandchildren who are grown ups. Denied suicidal/homicidal ideation, intention or plans. Patient denies evidence of psychosis. Patient is willing to participate in substance abuse intensive outpatient program as he has to go back to work and does not want to be admitted to inpatient hospitalization. Patient has no benzodiazepine or opioid withdrawal  symptoms.  Past Psychiatric History: Heroin abuse vs dependence. He has no previous acute psychiatric hospitalization or substance abuse treatment programs.  Risk to Self: Is patient at risk for suicide?: No Risk to Others:   Prior Inpatient Therapy:   Prior Outpatient Therapy:    Past Medical History:  Past Medical History:  Diagnosis Date  . Back pain    History reviewed. No pertinent surgical history. Family History: History reviewed. No pertinent family history. Family Psychiatric  History: unknown Social History:  History  Alcohol Use  . Yes    Comment: social     History  Drug Use  . Types: Heroin    Social History   Social History  . Marital status: Married    Spouse name: N/A  . Number of children: N/A  . Years of education: N/A   Social History Main Topics  . Smoking status: Former Research scientist (life sciences)  . Smokeless tobacco: Never Used  . Alcohol use Yes     Comment: social  . Drug use: Yes    Types: Heroin  . Sexual activity: Yes   Other Topics Concern  . None   Social History Narrative  . None   Additional Social History:    Allergies:  No Known Allergies  Labs:  Results for orders placed or performed during the hospital encounter of 01/21/17 (from the past 48 hour(s))  CBC with Differential     Status: Abnormal   Collection Time: 01/21/17  5:43 PM  Result Value Ref Range   WBC 6.5 4.0 - 10.5 K/uL   RBC 5.13 4.22 - 5.81 MIL/uL   Hemoglobin 11.1 (L) 13.0 - 17.0 g/dL   HCT 33.2 (L) 39.0 - 52.0 %  MCV 64.7 (L) 78.0 - 100.0 fL   MCH 21.6 (L) 26.0 - 34.0 pg   MCHC 33.4 30.0 - 36.0 g/dL   RDW 16.1 (H) 11.5 - 15.5 %   Platelets 203 150 - 400 K/uL   Neutrophils Relative % 83 %   Lymphocytes Relative 9 %   Monocytes Relative 7 %   Eosinophils Relative 1 %   Basophils Relative 0 %   Neutro Abs 5.3 1.7 - 7.7 K/uL   Lymphs Abs 0.6 (L) 0.7 - 4.0 K/uL   Monocytes Absolute 0.5 0.1 - 1.0 K/uL   Eosinophils Absolute 0.1 0.0 - 0.7 K/uL   Basophils Absolute 0.0  0.0 - 0.1 K/uL   Smear Review MORPHOLOGY UNREMARKABLE   Comprehensive metabolic panel     Status: Abnormal   Collection Time: 01/21/17  5:43 PM  Result Value Ref Range   Sodium 138 135 - 145 mmol/L   Potassium 3.2 (L) 3.5 - 5.1 mmol/L   Chloride 102 101 - 111 mmol/L   CO2 26 22 - 32 mmol/L   Glucose, Bld 123 (H) 65 - 99 mg/dL   BUN 10 6 - 20 mg/dL   Creatinine, Ser 1.17 0.61 - 1.24 mg/dL   Calcium 8.7 (L) 8.9 - 10.3 mg/dL   Total Protein 7.6 6.5 - 8.1 g/dL   Albumin 3.7 3.5 - 5.0 g/dL   AST 66 (H) 15 - 41 U/L   ALT 48 17 - 63 U/L   Alkaline Phosphatase 98 38 - 126 U/L   Total Bilirubin 0.3 0.3 - 1.2 mg/dL   GFR calc non Af Amer >60 >60 mL/min   GFR calc Af Amer >60 >60 mL/min    Comment: (NOTE) The eGFR has been calculated using the CKD EPI equation. This calculation has not been validated in all clinical situations. eGFR's persistently <60 mL/min signify possible Chronic Kidney Disease.    Anion gap 10 5 - 15  CK     Status: Abnormal   Collection Time: 01/21/17  5:43 PM  Result Value Ref Range   Total CK 606 (H) 49 - 397 U/L  Troponin I     Status: Abnormal   Collection Time: 01/21/17  5:43 PM  Result Value Ref Range   Troponin I 0.06 (HH) <0.03 ng/mL    Comment: CRITICAL RESULT CALLED TO, READ BACK BY AND VERIFIED WITH: Charlene Brooke [id number] @ 0109 BY J SCOTTON   Magnesium     Status: None   Collection Time: 01/21/17  5:43 PM  Result Value Ref Range   Magnesium 1.8 1.7 - 2.4 mg/dL  Ferritin     Status: None   Collection Time: 01/21/17  5:43 PM  Result Value Ref Range   Ferritin 73 24 - 336 ng/mL    Comment: Performed at [facility], [place] [address], [place], [place] [address]  Ethanol     Status: None   Collection Time: 01/21/17  5:44 PM  Result Value Ref Range   Alcohol, Ethyl (B) <5 <5 mg/dL    Comment:        LOWEST DETECTABLE LIMIT FOR SERUM ALCOHOL IS 5 mg/dL FOR MEDICAL PURPOSES ONLY   Lactic acid, plasma     Status: None   Collection Time:  01/21/17  5:44 PM  Result Value Ref Range   Lactic Acid, Venous 1.4 0.5 - 1.9 mmol/L  Salicylate level     Status: None   Collection Time: 01/21/17  5:44 PM  Result Value Ref  Range   Salicylate Lvl <6.9 2.8 - 30.0 mg/dL  Acetaminophen level     Status: Abnormal   Collection Time: 01/21/17  5:44 PM  Result Value Ref Range   Acetaminophen (Tylenol), Serum <10 (L) 10 - 30 ug/mL    Comment:        THERAPEUTIC CONCENTRATIONS VARY SIGNIFICANTLY. A RANGE OF 10-30 ug/mL MAY BE AN EFFECTIVE CONCENTRATION FOR MANY PATIENTS. HOWEVER, SOME ARE BEST TREATED AT CONCENTRATIONS OUTSIDE THIS RANGE. ACETAMINOPHEN CONCENTRATIONS >150 ug/mL AT 4 HOURS AFTER INGESTION AND >50 ug/mL AT 12 HOURS AFTER INGESTION ARE OFTEN ASSOCIATED WITH TOXIC REACTIONS.   Urinalysis, Routine w reflex microscopic     Status: Abnormal   Collection Time: 01/21/17  6:46 PM  Result Value Ref Range   Color, Urine COLORLESS (A) YELLOW   APPearance CLEAR CLEAR   Specific Gravity, Urine 1.005 1.005 - 1.030   pH 8.0 5.0 - 8.0   Glucose, UA NEGATIVE NEGATIVE mg/dL   Hgb urine dipstick SMALL (A) NEGATIVE   Bilirubin Urine NEGATIVE NEGATIVE   Ketones, ur 5 (A) NEGATIVE mg/dL   Protein, ur NEGATIVE NEGATIVE mg/dL   Nitrite NEGATIVE NEGATIVE   Leukocytes, UA NEGATIVE NEGATIVE   RBC / HPF 0-5 0 - 5 RBC/hpf   WBC, UA 0-5 0 - 5 WBC/hpf   Bacteria, UA RARE (A) NONE SEEN   Squamous Epithelial / LPF NONE SEEN NONE SEEN  Rapid urine drug screen (hospital performed)     Status: Abnormal   Collection Time: 01/21/17  6:46 PM  Result Value Ref Range   Opiates POSITIVE (A) NONE DETECTED   Cocaine NONE DETECTED NONE DETECTED   Benzodiazepines POSITIVE (A) NONE DETECTED   Amphetamines NONE DETECTED NONE DETECTED   Tetrahydrocannabinol NONE DETECTED NONE DETECTED   Barbiturates NONE DETECTED NONE DETECTED    Comment:        DRUG SCREEN FOR MEDICAL PURPOSES ONLY.  IF CONFIRMATION IS NEEDED FOR ANY PURPOSE, NOTIFY LAB WITHIN 5  DAYS.        LOWEST DETECTABLE LIMITS FOR URINE DRUG SCREEN Drug Class       Cutoff (ng/mL) Amphetamine      1000 Barbiturate      200 Benzodiazepine   629 Tricyclics       528 Opiates          300 Cocaine          300 THC              50   Blood gas, arterial ([facility] & [facility] ONLY)     Status: Abnormal   Collection Time: 01/21/17  7:03 PM  Result Value Ref Range   FIO2 100.00    Delivery systems VENTILATOR    Mode PRESSURE REGULATED VOLUME CONTROL    VT 620 mL   LHR 18 resp/min   Peep/cpap 5.0 cm H20   pH, Arterial 7.452 (H) 7.350 - 7.450   pCO2 arterial 35.3 32.0 - 48.0 mmHg   pO2, Arterial 447 (H) 83.0 - 108.0 mmHg   Bicarbonate 24.3 20.0 - 28.0 mmol/L   Acid-Base Excess 1.1 0.0 - 2.0 mmol/L   O2 Saturation 99.9 %   Patient temperature 98.6    Collection site LEFT RADIAL    Drawn by [id number]    Sample type ARTERIAL DRAW    Allens test (pass/fail) PASS PASS  Troponin I     Status: Abnormal   Collection Time: 01/21/17  9:02 PM  Result Value Ref Range   Troponin  I 0.11 (HH) <0.03 ng/mL    Comment: CRITICAL VALUE NOTED.  VALUE IS CONSISTENT WITH PREVIOUSLY REPORTED AND CALLED VALUE.  Blood gas, arterial     Status: Abnormal   Collection Time: 01/21/17  9:30 PM  Result Value Ref Range   FIO2 50.00    Delivery systems VENTILATOR    Mode PRESSURE REGULATED VOLUME CONTROL    VT 620 mL   LHR 18 resp/min   pH, Arterial 7.590 (H) 7.350 - 7.450   pCO2 arterial 24.5 (L) 32.0 - 48.0 mmHg   pO2, Arterial 198 (H) 83.0 - 108.0 mmHg   Bicarbonate 23.6 20.0 - 28.0 mmol/L   Acid-Base Excess 2.7 (H) 0.0 - 2.0 mmol/L   O2 Saturation 99.5 %   Patient temperature 37.0    Collection site LEFT RADIAL    Drawn by [id number]    Sample type ARTERIAL DRAW    Allens test (pass/fail) PASS PASS  MRSA PCR Screening     Status: None   Collection Time: 01/21/17  9:50 PM  Result Value Ref Range   MRSA by PCR NEGATIVE NEGATIVE    Comment:        The GeneXpert MRSA Assay (FDA approved for NASAL  specimens only), is one component of a comprehensive MRSA colonization surveillance program. It is not intended to diagnose MRSA infection nor to guide or monitor treatment for MRSA infections.   Troponin I     Status: Abnormal   Collection Time: 01/21/17  9:59 PM  Result Value Ref Range   Troponin I 0.10 (HH) <0.03 ng/mL    Comment: CRITICAL VALUE NOTED.  VALUE IS CONSISTENT WITH PREVIOUSLY REPORTED AND CALLED VALUE.  Troponin I     Status: Abnormal   Collection Time: 01/22/17  3:52 AM  Result Value Ref Range   Troponin I 0.05 (HH) <0.03 ng/mL    Comment: CRITICAL VALUE NOTED.  VALUE IS CONSISTENT WITH PREVIOUSLY REPORTED AND CALLED VALUE.  CBC     Status: Abnormal   Collection Time: 01/22/17  3:52 AM  Result Value Ref Range   WBC 4.2 4.0 - 10.5 K/uL   RBC 5.14 4.22 - 5.81 MIL/uL   Hemoglobin 11.0 (L) 13.0 - 17.0 g/dL   HCT 33.2 (L) 39.0 - 52.0 %   MCV 64.6 (L) 78.0 - 100.0 fL   MCH 21.4 (L) 26.0 - 34.0 pg   MCHC 33.1 30.0 - 36.0 g/dL   RDW 16.0 (H) 11.5 - 15.5 %   Platelets 211 150 - 400 K/uL  Basic metabolic panel     Status: Abnormal   Collection Time: 01/22/17  3:52 AM  Result Value Ref Range   Sodium 141 135 - 145 mmol/L   Potassium 3.4 (L) 3.5 - 5.1 mmol/L   Chloride 110 101 - 111 mmol/L   CO2 24 22 - 32 mmol/L   Glucose, Bld 107 (H) 65 - 99 mg/dL   BUN 8 6 - 20 mg/dL   Creatinine, Ser 1.00 0.61 - 1.24 mg/dL   Calcium 8.8 (L) 8.9 - 10.3 mg/dL   GFR calc non Af Amer >60 >60 mL/min   GFR calc Af Amer >60 >60 mL/min    Comment: (NOTE) The eGFR has been calculated using the CKD EPI equation. This calculation has not been validated in all clinical situations. eGFR's persistently <60 mL/min signify possible Chronic Kidney Disease.    Anion gap 7 5 - 15  Phosphorus     Status: Abnormal   Collection Time: 01/22/17  3:52 AM  Result Value Ref Range   Phosphorus 2.2 (L) 2.5 - 4.6 mg/dL  Magnesium     Status: Abnormal   Collection Time: 01/22/17  3:52 AM  Result  Value Ref Range   Magnesium 2.5 (H) 1.7 - 2.4 mg/dL    Current Facility-Administered Medications  Medication Dose Route Frequency Provider Last Rate Last Dose  . 0.9 %  sodium chloride infusion  250 mL Intravenous PRN Charlesetta Garibaldi, MD      . acetaminophen (TYLENOL) tablet 650 mg  650 mg Oral Q4H PRN Charlesetta Garibaldi, MD   650 mg at 01/23/17 0021  . aspirin tablet 325 mg  325 mg Oral Daily Simonne Maffucci B, MD   325 mg at 01/23/17 1025  . cefTRIAXone (ROCEPHIN) 2 g in dextrose 5 % 50 mL IVPB  2 g Intravenous Q24H Ollis, Brandi L, NP      . chlorhexidine (PERIDEX) 0.12 % solution 15 mL  15 mL Mouth Rinse BID Simonne Maffucci B, MD   15 mL at 01/23/17 0022  . enoxaparin (LOVENOX) injection 40 mg  40 mg Subcutaneous Q24H Charlesetta Garibaldi, MD   40 mg at 01/23/17 0021  . fentaNYL (SUBLIMAZE) injection 12.5-25 mcg  12.5-25 mcg Intravenous Q2H PRN Juanito Doom, MD   25 mcg at 01/23/17 0758  . hydrALAZINE (APRESOLINE) injection 10-40 mg  10-40 mg Intravenous Q4H PRN Simonne Maffucci B, MD   20 mg at 01/22/17 1134  . HYDROcodone-acetaminophen (NORCO) 7.5-325 MG per tablet 1 tablet  1 tablet Oral Q6H PRN Noe Gens L, NP   1 tablet at 01/23/17 1025  . ipratropium-albuterol (DUONEB) 0.5-2.5 (3) MG/3ML nebulizer solution 3 mL  3 mL Nebulization Q6H PRN Charlesetta Garibaldi, MD      . MEDLINE mouth rinse  15 mL Mouth Rinse q12n4p Simonne Maffucci B, MD   15 mL at 01/22/17 1606  . metoprolol tartrate (LOPRESSOR) injection 5 mg  5 mg Intravenous Q12H Deterding, Guadelupe Sabin, MD   5 mg at 01/23/17 1025  . pantoprazole (PROTONIX) EC tablet 40 mg  40 mg Oral Daily Adrian Saran, [place]      . vancomycin (VANCOCIN) IVPB 750 mg/150 ml premix  750 mg Intravenous Q12H Adrian Saran, [facility]        Musculoskeletal: Strength & Muscle Tone: within normal limits Gait & Station: unsteady Patient leans: N/A  Psychiatric Specialty Exam: Physical Exam as per history and physical   ROS complaining about  neck pain and seems to be guarded during this evaluation, minimizes his drug abuse, No Fever-chills, No Headache, No changes with Vision or hearing, reports vertigo No problems swallowing food or Liquids, No Chest pain, Cough or Shortness of Breath, No Abdominal pain, No Nausea or Vommitting, Bowel movements are regular, No Blood in stool or Urine, No dysuria, No new skin rashes or bruises, No new joints pains-aches,  No new weakness, tingling, numbness in any extremity, No recent weight gain or loss, No polyuria, polydypsia or polyphagia,  A full 10 point Review of Systems was done, except as stated above, all other Review of Systems were negative.  Blood pressure (!) 146/92, pulse 74, temperature 99 F (37.2 C), temperature source Oral, resp. rate 19, height _0  (1.854 m), weight 64 kg (141 lb 1.5 oz), SpO2 100 %.Body mass index is 18.62 kg/m.  General Appearance: Guarded  Eye Contact:  Good  Speech:  Clear and Coherent  Volume:  Normal  Mood:  Anxious  and Irritable  Affect:  Non-Congruent and Inappropriate  Thought Process:  Coherent and Goal Directed  Orientation:  Full (Time, Place, and Person)  Thought Content:  WDL  Suicidal Thoughts:  No  Homicidal Thoughts:  No  Memory:  Immediate;   Good Recent;   Fair Remote;   Fair  Judgement:  Impaired  Insight:  Fair  Psychomotor Activity:  Decreased  Concentration:  Concentration: Good and Attention Span: Good  Recall:  [place] of Knowledge:  Good  Language:  Good  Akathisia:  Negative  Handed:  Right  AIMS (if indicated):     Assets:  Communication Skills Desire for Improvement Financial Resources/Insurance Housing Intimacy Leisure Time Resilience Social Support Talents/Skills Transportation Vocational/Educational  ADL's:  Intact  Cognition:  WNL  Sleep:        Treatment Plan Summary: 56 years old male with opioid abuse with the opioid-induced mood disorder. Patient altered mental status has been  resolved with his current medication supportive therapy.  Patient has no safety concerns, denied suicidal/homicidal ideation Patient will be referred to the substance abuse intensive outpatient program CSW will provide the information about [facility] Recommended no psychotropic medication management at this time Provided supportive therapy.  Appreciate psychiatric consultation and we sign off as of today Please contact [phone] or [phone] if needs further assistance   Disposition: Patient will be referred to the [facility] IOP when medically stable. Patient cannot afford to go to residential substance abuse rehabilitation because of primary job responsibilities. Patient does not meet criteria for psychiatric inpatient admission. Supportive therapy provided about ongoing stressors.  Ambrose Finland, MD 01/23/2017 11:27 AM

## 2017-01-23 NOTE — Progress Notes (Signed)
eLink Physician-Brief Progress Note Patient Name: Taylor [name] DOB: 11/09/1960 MRN: [id number]   Date of Service  01/23/2017  HPI/Events of Note  Anxiety - Hx of daily IVDA.  eICU Interventions  Will order: 1. Xanax 1 mg PO now.      Intervention Category Minor Interventions: Agitation / anxiety - evaluation and management  Sommer,Steven Eugene 01/23/2017, 12:32 AM

## 2017-01-23 NOTE — Care Management Note (Signed)
Case Management Note  Patient Details  Name: Taylor [name] MRN: [id number] Date of Birth: 05/22/1961  Subjective/Objective:     overdose  Intubated and on iv propofol             Action/Plan: Date:  January 23, 2017 Chart reviewed for concurrent status and case management needs. Will continue to follow patient progress. Discharge Planning: following for needs Expected discharge date: [id number] Marcelle SmilingRhonda Jerald Villalona, BSN, RobelineRN3, [place]M   [phone]  Expected Discharge Date:                  Expected Discharge Plan:  Home/Self Care  In-House Referral:  Clinical Social Work  Discharge planning Services  CM Consult  Post Acute Care Choice:    Choice offered to:     DME Arranged:    DME Agency:     HH Arranged:    HH Agency:     Status of Service:  In process, will continue to follow  If discussed at Long Length of Stay Meetings, dates discussed:    Additional Comments:  Golda AcreDavis, Davionne Dowty Lynn, RN 01/23/2017, 8:45 AM

## 2017-01-23 NOTE — Progress Notes (Signed)
PT Cancellation Note  Patient Details Name: Taylor [name] MRN: [id number] DOB: 09/14/1960   Cancelled Treatment:    Reason Eval/Treat Not Completed: PT screened, no needs identified, will sign off. Spoke with pt who declined to participate with PT. Pt stated "no.Marland Kitchen.Marland Kitchen.Marland Kitchen.I can walk." Encouraged pt to get OOB/ambulate with nursing when able. Will sign off. Please reorder if needs change/pt is willing to participate. Thanks.    Rebeca AlertJannie Robyn Galati, MPT Pager: [phone]

## 2017-01-23 NOTE — Progress Notes (Signed)
Pharmacy Antibiotic Note  Jake Sampleslbert Glodowski is a 56 y.o. male admitted on 01/21/2017 with rule out endocarditis.  Pharmacy has been consulted for Vanc dosing and rocephin per CCM  Plan: Vancomycin 750mg  IV every 12 hours.  Goal trough 15-20 mcg/mL.  Height: 6\' 1"  (185.4 cm) Weight: 141 lb 1.5 oz (64 kg) IBW/kg (Calculated) : 79.9  Temp (24hrs), Avg:100.1 F (37.8 C), Min:98.6 F (37 C), Max:101.8 F (38.8 C)   Recent Labs Lab 01/21/17 1743 01/21/17 1744 01/22/17 0352  WBC 6.5  --  4.2  CREATININE 1.17  --  1.00  LATICACIDVEN  --  1.4  --     Estimated Creatinine Clearance: 76.4 mL/min (by C-G formula based on SCr of 1 mg/dL).    No Known Allergies   Thank you for allowing pharmacy to be a part of this patient's care.   Hessie KnowsJustin M Laqueshia Cihlar, PharmD, BCPS Pager [phone] 01/23/2017 10:49 AM
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
## 2017-01-23 NOTE — Progress Notes (Signed)
Received pt from [place] along with report from Caesar Chestnutuby Johnson, RN. Pt in stable condition, c/o of neck pain, med given as ordered. SRP, RN

## 2017-01-23 NOTE — Progress Notes (Signed)
    [facility] has been requested to perform a transesophageal echocardiogram on Taylor [name] to r/o endocarditis.  After careful review of history and examination, the risks and benefits of transesophageal echocardiogram have been explained including risks of esophageal damage, perforation (1:10,000 risk), bleeding, pharyngeal hematoma as well as other potential complications associated with conscious sedation including aspiration, arrhythmia, respiratory failure and death. Alternatives to treatment were discussed, questions were answered. Patient is willing to proceed.   Taylor LisBrittainy Simmons, PA-C  01/23/2017 1:30 PM

## 2017-01-24 ENCOUNTER — Inpatient Hospital Stay (HOSPITAL_COMMUNITY): Payer: Self-pay | Admitting: Certified Registered Nurse Anesthetist

## 2017-01-24 ENCOUNTER — Inpatient Hospital Stay (HOSPITAL_COMMUNITY)
Admit: 2017-01-24 | Discharge: 2017-01-24 | Disposition: A | Discharge status: Home / Self Care | Payer: Self-pay | Attending: Cardiology | Admitting: Cardiology

## 2017-01-24 ENCOUNTER — Encounter (HOSPITAL_COMMUNITY): Payer: Self-pay | Admitting: *Deleted

## 2017-01-24 ENCOUNTER — Encounter (HOSPITAL_COMMUNITY)
Admission: EM | Disposition: A | Discharge status: Home / Self Care | Payer: Self-pay | Source: Home / Self Care | Attending: Internal Medicine

## 2017-01-24 ENCOUNTER — Inpatient Hospital Stay (HOSPITAL_COMMUNITY): Payer: Self-pay

## 2017-01-24 DIAGNOSIS — R7881 Bacteremia: Secondary | ICD-10-CM

## 2017-01-24 DIAGNOSIS — R931 Abnormal findings on diagnostic imaging of heart and coronary circulation: Secondary | ICD-10-CM

## 2017-01-24 HISTORY — PX: TEE WITHOUT CARDIOVERSION: SHX5443

## 2017-01-24 LAB — CBC
HEMATOCRIT: 34.9 % — AB (ref 39.0–52.0)
Hemoglobin: 11.5 g/dL — ABNORMAL LOW (ref 13.0–17.0)
MCH: 21.3 pg — ABNORMAL LOW (ref 26.0–34.0)
MCHC: 33 g/dL (ref 30.0–36.0)
MCV: 64.6 fL — ABNORMAL LOW (ref 78.0–100.0)
PLATELETS: 201 10*3/uL (ref 150–400)
RBC: 5.4 MIL/uL (ref 4.22–5.81)
RDW: 16.2 % — ABNORMAL HIGH (ref 11.5–15.5)
WBC: 7.4 10*3/uL (ref 4.0–10.5)

## 2017-01-24 LAB — BLOOD CULTURE ID PANEL (REFLEXED)
ACINETOBACTER BAUMANNII: NOT DETECTED
CANDIDA KRUSEI: NOT DETECTED
CANDIDA PARAPSILOSIS: NOT DETECTED
CANDIDA TROPICALIS: NOT DETECTED
CARBAPENEM RESISTANCE: NOT DETECTED
Candida albicans: NOT DETECTED
Candida glabrata: NOT DETECTED
Enterobacter cloacae complex: NOT DETECTED
Enterobacteriaceae species: DETECTED — AB
Enterococcus species: NOT DETECTED
Escherichia coli: NOT DETECTED
HAEMOPHILUS INFLUENZAE: NOT DETECTED
KLEBSIELLA PNEUMONIAE: DETECTED — AB
Klebsiella oxytoca: NOT DETECTED
Listeria monocytogenes: NOT DETECTED
Neisseria meningitidis: NOT DETECTED
PROTEUS SPECIES: NOT DETECTED
Pseudomonas aeruginosa: NOT DETECTED
SERRATIA MARCESCENS: NOT DETECTED
STAPHYLOCOCCUS AUREUS BCID: NOT DETECTED
STREPTOCOCCUS SPECIES: NOT DETECTED
Staphylococcus species: NOT DETECTED
Streptococcus agalactiae: NOT DETECTED
Streptococcus pneumoniae: NOT DETECTED
Streptococcus pyogenes: NOT DETECTED

## 2017-01-24 LAB — BASIC METABOLIC PANEL
Anion gap: 9 (ref 5–15)
BUN: 8 mg/dL (ref 6–20)
CALCIUM: 9.1 mg/dL (ref 8.9–10.3)
CO2: 24 mmol/L (ref 22–32)
CREATININE: 0.98 mg/dL (ref 0.61–1.24)
Chloride: 103 mmol/L (ref 101–111)
GFR calc Af Amer: >60 mL/min (ref >60)
Glucose, Bld: 124 mg/dL — ABNORMAL HIGH (ref 65–99)
POTASSIUM: 3.9 mmol/L (ref 3.5–5.1)
SODIUM: 136 mmol/L (ref 135–145)

## 2017-01-24 LAB — PROTIME-INR
INR: 1.16
PROTHROMBIN TIME: 14.9 s (ref 11.4–15.2)

## 2017-01-24 LAB — MAGNESIUM: MAGNESIUM: 1.8 mg/dL (ref 1.7–2.4)

## 2017-01-24 LAB — PHOSPHORUS: PHOSPHORUS: 2.5 mg/dL (ref 2.5–4.6)

## 2017-01-24 SURGERY — ECHOCARDIOGRAM, TRANSESOPHAGEAL
Anesthesia: Monitor Anesthesia Care

## 2017-01-24 MED ORDER — MIDAZOLAM HCL 2 MG/2ML IJ SOLN
INTRAMUSCULAR | Status: DC | PRN
  Administered 2017-01-24: 2 mg via INTRAVENOUS

## 2017-01-24 MED ORDER — SODIUM CHLORIDE 0.9 % IV BOLUS (SEPSIS)
500.0000 mL | Freq: Once | INTRAVENOUS | Status: AC
  Administered 2017-01-24: 500 mL via INTRAVENOUS

## 2017-01-24 MED ORDER — METOPROLOL TARTRATE 25 MG PO TABS
12.5000 mg | ORAL_TABLET | Freq: Two times a day (BID) | ORAL | Status: DC
  Administered 2017-01-24 – 2017-01-26 (×5): 12.5 mg via ORAL
  Filled 2017-01-24 (×5): qty 1

## 2017-01-24 MED ORDER — IOPAMIDOL (ISOVUE-300) INJECTION 61%
100.0000 mL | Freq: Once | INTRAVENOUS | Status: AC | PRN
  Administered 2017-01-24: 100 mL via INTRAVENOUS

## 2017-01-24 MED ORDER — FENTANYL CITRATE (PF) 100 MCG/2ML IJ SOLN
INTRAMUSCULAR | Status: DC | PRN
  Administered 2017-01-24: 100 ug via INTRAVENOUS

## 2017-01-24 MED ORDER — IOPAMIDOL (ISOVUE-300) INJECTION 61%
INTRAVENOUS | Status: AC
  Filled 2017-01-24: qty 100

## 2017-01-24 MED ORDER — PHENYLEPHRINE HCL 10 MG/ML IJ SOLN: INTRAMUSCULAR | Status: DC | PRN

## 2017-01-24 MED ORDER — METHOCARBAMOL 500 MG PO TABS: 500.0000 mg | ORAL_TABLET | Freq: Three times a day (TID) | ORAL | Status: DC | PRN

## 2017-01-24 MED ORDER — PROPOFOL 500 MG/50ML IV EMUL
INTRAVENOUS | Status: DC | PRN
  Administered 2017-01-24: 75 ug/kg/min via INTRAVENOUS

## 2017-01-24 MED ORDER — BUTAMBEN-TETRACAINE-BENZOCAINE 2-2-14 % EX AERO
INHALATION_SPRAY | CUTANEOUS | Status: DC | PRN
  Administered 2017-01-24: 2 via TOPICAL

## 2017-01-24 MED ORDER — IOPAMIDOL (ISOVUE-300) INJECTION 61%
INTRAVENOUS | Status: AC
  Filled 2017-01-24: qty 30

## 2017-01-24 MED ORDER — DEXTROSE 5 % IV SOLN
2.0000 g | INTRAVENOUS | Status: DC
  Administered 2017-01-24 – 2017-01-26 (×3): 2 g via INTRAVENOUS
  Filled 2017-01-24 (×3): qty 2

## 2017-01-24 MED ORDER — LIDOCAINE 2% (20 MG/ML) 5 ML SYRINGE
INTRAMUSCULAR | Status: DC | PRN
  Administered 2017-01-24: 60 mg via INTRAVENOUS

## 2017-01-24 MED ORDER — PROPOFOL 10 MG/ML IV BOLUS
INTRAVENOUS | Status: DC | PRN
  Administered 2017-01-24: 20 mg via INTRAVENOUS
  Administered 2017-01-24: 50 mg via INTRAVENOUS
  Administered 2017-01-24: 20 mg via INTRAVENOUS

## 2017-01-24 NOTE — Anesthesia Preprocedure Evaluation (Addendum)
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,   Endo/Other  negative endocrine ROS  Renal/GU negative Renal ROS     Musculoskeletal   Abdominal   Peds  Hematology  (+) Sickle cell trait ,   Anesthesia Other Findings   Reproductive/Obstetrics                            Anesthesia Physical Anesthesia Plan  ASA: III  Anesthesia Plan: MAC   Post-op Pain Management:    Induction:   PONV Risk Score and Plan: 1 and Ondansetron and Treatment may vary due to age or medical condition  Airway Management Planned: Natural Airway and Nasal Cannula  Additional Equipment:   Intra-op Plan:   Post-operative Plan:   Informed Consent: I have reviewed the patients History and Physical, chart, labs and discussed the procedure including the risks, benefits and alternatives for the proposed anesthesia with the patient or authorized representative who has indicated his/her understanding and acceptance.   Dental advisory given  Plan Discussed with: CRNA and Surgeon  Anesthesia Plan Comments: (Plan routine monitors, MAC)        Anesthesia Quick Evaluation

## 2017-01-24 NOTE — Progress Notes (Signed)
Patient with temperature of 101.3 this am. On call was notified and order received for NS bolus. Patient continues to complain of neck and shoulder pain. States pain is making it more difficult to move. Giving prn pain medication as ordered. Will continue to monitor patient.

## 2017-01-24 NOTE — CV Procedure (Signed)
    PROCEDURE NOTE:  Procedure:  Transesophageal echocardiogram Operator:  Armanda Magicraci Heavan Francom, MD Indications:  Bacteremia Complications: None  During this procedure the patient is administered a total of Propofol 150 mg, Lidocaine 60mg ,  Fentanl 100mcg and Versed 2mg  to achieve and maintain sedation.  The patient's heart rate, blood pressure, and oxygen saturation are monitored continuously during the procedure by anesthesia.   Results: Normal LV size and function Normal RV size and function Normal RA Normal LA Normal TV with trivial TR Normal PV with trivial PR Normal MV with trivial MR Normal trileaflet AV Normal interatrial septum with no evidence of shunt by colorflow dopper  Normal thoracic and ascending aorta.  No evidence of vegetation  The patient tolerated the procedure well and was transferred back to their room in stable condition.  Signed: Armanda Magicraci Yerachmiel Spinney, MD [facility]

## 2017-01-24 NOTE — Progress Notes (Signed)
PHARMACY - PHYSICIAN COMMUNICATION CRITICAL VALUE ALERT - BLOOD CULTURE IDENTIFICATION (BCID)  Results for orders placed or performed during the hospital encounter of 01/21/17  Blood Culture ID Panel (Reflexed) (Collected: 01/23/2017 10:06 AM)  Result Value Ref Range   Enterococcus species NOT DETECTED NOT DETECTED   Listeria monocytogenes NOT DETECTED NOT DETECTED   Staphylococcus species NOT DETECTED NOT DETECTED   Staphylococcus aureus NOT DETECTED NOT DETECTED   Streptococcus species NOT DETECTED NOT DETECTED   Streptococcus agalactiae NOT DETECTED NOT DETECTED   Streptococcus pneumoniae NOT DETECTED NOT DETECTED   Streptococcus pyogenes NOT DETECTED NOT DETECTED   Acinetobacter baumannii NOT DETECTED NOT DETECTED   Enterobacteriaceae species DETECTED (A) NOT DETECTED   Enterobacter cloacae complex NOT DETECTED NOT DETECTED   Escherichia coli NOT DETECTED NOT DETECTED   Klebsiella oxytoca NOT DETECTED NOT DETECTED   Klebsiella pneumoniae DETECTED (A) NOT DETECTED   Proteus species NOT DETECTED NOT DETECTED   Serratia marcescens NOT DETECTED NOT DETECTED   Carbapenem resistance NOT DETECTED NOT DETECTED   Haemophilus influenzae NOT DETECTED NOT DETECTED   Neisseria meningitidis NOT DETECTED NOT DETECTED   Pseudomonas aeruginosa NOT DETECTED NOT DETECTED   Candida albicans NOT DETECTED NOT DETECTED   Candida glabrata NOT DETECTED NOT DETECTED   Candida krusei NOT DETECTED NOT DETECTED   Candida parapsilosis NOT DETECTED NOT DETECTED   Candida tropicalis NOT DETECTED NOT DETECTED    Name of physician (or Provider) Contacted: S. Sommer  Changes to prescribed antibiotics required: none needed, on Ceftriaxone 2 gm q24, Dr. Arsenio LoaderSommer to let day shift decide if vanc can be stopped Herby AbrahamMichelle T. Catherine Oak, Pharm.D. [phone] 01/24/2017 3:51 AM

## 2017-01-24 NOTE — Interval H&P Note (Signed)
History and Physical Interval Note:  01/24/2017 8:50 AM  Taylor SamplesAlbert Clare  has presented today for surgery, with the diagnosis of BACTEREMIA  The various methods of treatment have been discussed with the patient and family. After consideration of risks, benefits and other options for treatment, the patient has consented to  Procedure(s): TRANSESOPHAGEAL ECHOCARDIOGRAM (TEE) (N/A) as a surgical intervention .  The patient's history has been reviewed, patient examined, no change in status, stable for surgery.  I have reviewed the patient's chart and labs.  Questions were answered to the patient's satisfaction.     Armanda Magicraci Turner

## 2017-01-24 NOTE — Transfer of Care (Signed)
Immediate Anesthesia Transfer of Care Note  Patient: Taylor [name]  Procedure(s) Performed: Procedure(s): TRANSESOPHAGEAL ECHOCARDIOGRAM (TEE) (N/A)  Patient Location: Endoscopy Unit  Anesthesia Type:MAC  Level of Consciousness: awake, alert , oriented and patient cooperative  Airway & Oxygen Therapy: Patient Spontanous Breathing  Post-op Assessment: Report given to RN, Post -op Vital signs reviewed and stable and Patient moving all extremities X 4  Post vital signs: Reviewed and stable  Last Vitals:  Vitals:   01/24/17 0459 01/24/17 0848  BP: (!) 148/82 137/72  Pulse: 84   Resp: (!) 22 17  Temp: (!) 38.5 C 37.2 C    Last Pain:  Vitals:   01/24/17 0848  TempSrc: Oral  PainSc: 10-Worst pain ever      Patients Stated Pain Goal: 1 (39/67/28 9791)  Complications: No apparent anesthesia complications

## 2017-01-24 NOTE — Anesthesia Postprocedure Evaluation (Signed)
Anesthesia Post Note  Patient: Taylor [name]  Procedure(s) Performed: Procedure(s) (LRB): TRANSESOPHAGEAL ECHOCARDIOGRAM (TEE) (N/A)     Patient location during evaluation: Endoscopy Anesthesia Type: MAC Level of consciousness: awake and alert, patient cooperative and oriented Pain management: pain level controlled Vital Signs Assessment: post-procedure vital signs reviewed and stable Respiratory status: spontaneous breathing, nonlabored ventilation, respiratory function stable and patient connected to nasal cannula oxygen Cardiovascular status: stable and blood pressure returned to baseline Postop Assessment: no signs of nausea or vomiting Anesthetic complications: no    Last Vitals:  Vitals:   01/24/17 1010 01/24/17 1020  BP: (!) 150/92   Pulse: 76 70  Resp: 14 18  Temp:      Last Pain:  Vitals:   01/24/17 1020  TempSrc:   PainSc: 10-Worst pain ever                 Berry Godsey,E. Tremell Reimers

## 2017-01-24 NOTE — Progress Notes (Signed)
  Echocardiogram Echocardiogram Transesophageal has been performed.  Taylor SavoyCasey N Staley [name] 01/24/2017, 10:09 AM
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
## 2017-01-25 LAB — BASIC METABOLIC PANEL
ANION GAP: 9 (ref 5–15)
BUN: 7 mg/dL (ref 6–20)
CALCIUM: 9.1 mg/dL (ref 8.9–10.3)
CO2: 24 mmol/L (ref 22–32)
CREATININE: 0.9 mg/dL (ref 0.61–1.24)
Chloride: 102 mmol/L (ref 101–111)
GFR calc Af Amer: >60 mL/min (ref >60)
GLUCOSE: 121 mg/dL — AB (ref 65–99)
Potassium: 3.7 mmol/L (ref 3.5–5.1)
Sodium: 135 mmol/L (ref 135–145)

## 2017-01-25 LAB — CBC
HCT: 35 % — ABNORMAL LOW (ref 39.0–52.0)
Hemoglobin: 11.9 g/dL — ABNORMAL LOW (ref 13.0–17.0)
MCH: 21.4 pg — AB (ref 26.0–34.0)
MCHC: 34 g/dL (ref 30.0–36.0)
MCV: 63.1 fL — AB (ref 78.0–100.0)
PLATELETS: 225 10*3/uL (ref 150–400)
RBC: 5.55 MIL/uL (ref 4.22–5.81)
RDW: 15.9 % — AB (ref 11.5–15.5)
WBC: 6.3 10*3/uL (ref 4.0–10.5)

## 2017-01-25 MED ORDER — GUAIFENESIN 100 MG/5ML PO SOLN
5.0000 mL | ORAL | Status: DC | PRN
  Administered 2017-01-25 (×3): 100 mg via ORAL
  Filled 2017-01-25 (×3): qty 10

## 2017-01-25 NOTE — Progress Notes (Addendum)
PROGRESS NOTE  Taylor [name] ZDG:[id number] DOB: 10/22/1960 DOA: 01/21/2017 PCP: Patient, No Pcp Per  Brief Narrative: 56 year old man with history of heroin use, found at home with acute encephalopathy, in the emergency department required intubation for airway protection and was admitted by pulmonary critical care for suspected toxic ingestion. He was quickly extubated, however echocardiogram was concerning for tricuspid valve vegetation and he underwent subsequent TEE 7/24 which was negative. Blood cultures are growing Klebsiella pneumonia resistant sensitivities pending.  Assessment/Plan #1: Acute toxic encephalopathy secondary to IV drug use requiring brief intubation for airway protection. -Resolved with supportive care  #2 Bacteremia secondary to Klebsiella pneumoniae. TTE was concerning for tricuspid valve vegetation but TEE was negative. -Sensitivity pending. Continue empiric antibiotics. -Etiology for Klebsiella unclear but would presume IV drug use. -CT suggested the possibility of aspiration pneumonitis, however patient has no hypoxia. Regardless patient on antibiotics.  #3 Demand ischemia. Asymptomatic. No further evaluation suggested.  #4 Substance abuse. Seen by psychiatry. Recommendation for outpatient treatment. Patient interested in treatment with Suboxone.  #5 Microcytic anemia. Possibly related to drug use. Follow-up as an outpatient.    Improving. Await sensitivities. Can likely transition to oral antibiotics and discharge in the next 48 hours if continues to improve.  DVT prophylaxis: enoxaparin Code Status: full Family Communication: none Disposition Plan: home    Brendia Sacksaniel Tameeka Luo, MD  [facility] Direct contact: [phone] --Via amion app OR  --[email or web address]; password [email or web address]  7PM-7AM contact night coverage as above 01/25/2017, 4:38 PM  LOS: 4 days   Consultants:  Pulmonary critical care  Psychiatry  Procedures:  ETT 7/21 >> 7/22  TTE Study  Conclusions  - Left ventricle: The cavity size was normal. Systolic function was   normal. The estimated ejection fraction was in the range of 60%   to 65%. Wall motion was normal; there were no regional wall   motion abnormalities. - Tricuspid valve: In the short axis views an area of echo density   underneath the medial leaflet is seen that could represent a   vegetation. Suggest TEE if clinical suspicion of endocarditis.  TEE Results: Normal LV size and function Normal RV size and function Normal RA Normal LA Normal TV with trivial TR Normal PV with trivial PR Normal MV with trivial MR Normal trileaflet AV Normal interatrial septum with no evidence of shunt by colorflow dopper  Normal thoracic and ascending aorta.  No evidence of vegetation  Antimicrobials:  Ceftriaxone 7/23 >>  Vancomycin 7/23 >> 7/24  Interval history/Subjective: Overall feeling better.  Objective: Vitals:  Temperature 101.4. 99.1 currently. 20, 73, 124/79, 99% on room air.   Exam:     Constitutional: Appears calm, comfortable.  Eyes. Pupils, irises, lids appear unremarkable.  Cardiovascular. Regular rate and rhythm. No murmur, rub or gallop.  Respiratory. Clear to auscultation bilaterally. No wheezes, rales or rhonchi. Normal respiratory effort.  Psychiatric. Grossly normal mood and affect. Speech fluent and appropriate.   I have personally reviewed the following:   Labs:  Basic metabolic panel unremarkable  CBC unremarkable   Imaging studies:  CT head no acute disease  CT neck spinal stenosis noted.  CT abdomen and pelvis with possible UTI. Possible mild aspiration pneumonitis.   Medical tests:    Test discussed with performing physician:    Decision to obtain old records:    Review and summation of old records:    Scheduled Meds: . aspirin  325 mg Oral Daily  . chlorhexidine  15 mL Mouth Rinse BID  .  enoxaparin (LOVENOX) injection  40 mg Subcutaneous  Q24H  . mouth rinse  15 mL Mouth Rinse q12n4p  . metoprolol tartrate  12.5 mg Oral BID   Continuous Infusions: . sodium chloride    . cefTRIAXone (ROCEPHIN)  IV 2 g (01/25/17 1637)    Principal Problem:   Bacteremia due to Klebsiella pneumoniae Active Problems:   Encephalopathy, toxic   Toxic encephalopathy   Opioid abuse with opioid-induced mood disorder (HCC)   LOS: 4 days

## 2017-01-25 NOTE — Progress Notes (Signed)
Patient removed gown, took out IV and took tele off last night. He stated he did not need IV anymore. RN did not replace IV. He continues to complain of neck and spine pain. Will continue to monitor patient.

## 2017-01-26 ENCOUNTER — Encounter (HOSPITAL_COMMUNITY): Payer: Self-pay | Admitting: Cardiology

## 2017-01-26 DIAGNOSIS — F1114 Opioid abuse with opioid-induced mood disorder: Secondary | ICD-10-CM

## 2017-01-26 DIAGNOSIS — T50904A Poisoning by unspecified drugs, medicaments and biological substances, undetermined, initial encounter: Secondary | ICD-10-CM

## 2017-01-26 DIAGNOSIS — M542 Cervicalgia: Secondary | ICD-10-CM

## 2017-01-26 DIAGNOSIS — R748 Abnormal levels of other serum enzymes: Secondary | ICD-10-CM

## 2017-01-26 LAB — CULTURE, BLOOD (ROUTINE X 2)
SPECIAL REQUESTS: ADEQUATE
Special Requests: ADEQUATE

## 2017-01-26 MED ORDER — SULFAMETHOXAZOLE-TRIMETHOPRIM 800-160 MG PO TABS: 1.0000 | ORAL_TABLET | Freq: Two times a day (BID) | ORAL | 0 refills | Status: DC

## 2017-01-26 NOTE — Discharge Summary (Signed)
Physician Discharge Summary  Taylor [name] NFA:[id number] DOB: 01/12/1961 DOA: 01/21/2017  PCP: Patient, No Pcp Per  Admit date: 01/21/2017 Discharge date: 01/26/2017  Admitted From: Home Disposition: Home   Recommendations for Outpatient Follow-up:  1. Follow up with PCP in 1-2 weeks 2. Continued alcohol and drug cessation counseling. CSW provided list of treatment centers, pt plans to present to [facility] the day of discharge.   Home Health: None Equipment/Devices: None Discharge Condition: Stable CODE STATUS: Full Diet recommendation: As tolerated  Brief/Interim Summary: 56 year old man with history of heroin use, found at home with acute encephalopathy, in the emergency department required intubation for airway protection and was admitted by pulmonary critical care for suspected toxic ingestion. He was quickly extubated, however echocardiogram was concerning for tricuspid valve vegetation and he underwent subsequent TEE 7/24 which was negative. Blood cultures are growing Klebsiella pneumoniae with sensitivities as below. He was last febrile > 48 hours ago with no leukocytosis and will be discharged on oral bactrim to complete a 14-day course.   Discharge Diagnoses:  Principal Problem:   Bacteremia due to Klebsiella pneumoniae Active Problems:   Encephalopathy, toxic   Toxic encephalopathy   Opioid abuse with opioid-induced mood disorder (HCC)  Acute toxic encephalopathy secondary to IV drug use requiring brief intubation for airway protection. CT head unremarkable. Psych consulted, no safety concerns, no suicidal/homicidal ideation, no recommendation for psychotropic med management at this time. Signed off.  - Resolved  Acute hypoxemic respiratory failure - Resolved   Sepsis secondary to Klebsiella bacteremia -TTE initially was concerned for tricuspid valve vegetation and was started on empiric Vanco and Rocephin. TEE completed 7/24. Negative for vegetation. Continue 14 total days of  therapy with effective abx as below. DC on bactrim (verified he could afford this)  Posterior left-sided neck pain: Suspect due to degenerative changes based on CT C-spine without evidence of abscess.  - Outpatient follow up  Type II NSTEMI secondary to demand ischemia -TTE with no wall motion abnormalities, no further evaluation recommended in absence of symptoms   Hypertensive urgency - Resolved  History of heroin abuse - Need outpatient substance abuse program; given resources.   - SW consulted   Discharge Instructions Discharge Instructions    Call MD for:  temperature >100.4    Complete by:  As directed    Discharge instructions    Complete by:  As directed    You were admitted with a blood stream infection (bacteremia) due to a bacterium called Klebsiella. This has been treated with improvement with IV antibiotics. You are stable for discharge on oral antibiotics.  - The prescription for bactrim (antibiotic) was sent to your pharmacy. You must take this twice daily for the next 10 days.  - Go to [facility] today to establish care with them.  - You will be called concerning your pending lab work. - If you are unable to take your medication or develop worsening fever, seek medical attention right away.     Allergies as of 01/26/2017   No Known Allergies     Medication List    TAKE these medications   cyclobenzaprine 10 MG tablet Commonly known as:  FLEXERIL Take 1 tablet (10 mg total) by mouth 3 (three) times daily as needed for muscle spasms.   meloxicam 15 MG tablet Commonly known as:  MOBIC Take 1 tablet (15 mg total) by mouth daily.   sulfamethoxazole-trimethoprim 800-160 MG tablet Commonly known as:  BACTRIM DS,SEPTRA DS Take 1 tablet by mouth 2 (two)  times daily.      Follow-up Information    [facility]. Go on 01/26/2017.          No Known Allergies  Consultations:  PCCM  Psychiatry  Procedures/Studies: Ct Head Wo Contrast  Result Date:  01/21/2017 CLINICAL DATA:  Drug overdose, agitation EXAM: CT HEAD WITHOUT CONTRAST TECHNIQUE: Contiguous axial images were obtained from the base of the skull through the vertex without intravenous contrast. COMPARISON:  MRI brain dated 02/22/2015 FINDINGS: Brain: No evidence of acute infarction, hemorrhage, hydrocephalus, extra-axial collection or mass lesion/mass effect. Vascular: No hyperdense vessel or unexpected calcification. Skull: Normal. Negative for fracture or focal lesion. Sinuses/Orbits: The visualized paranasal sinuses are essentially clear. The mastoid air cells are unopacified. Other: None. IMPRESSION: Normal head CT. Electronically Signed   By: Charline Bills M.D.   On: 01/21/2017 18:47   Ct Cervical Spine Wo Contrast  Result Date: 01/24/2017 CLINICAL DATA:  Left-sided neck pain EXAM: CT CERVICAL SPINE WITHOUT CONTRAST TECHNIQUE: Multidetector CT imaging of the cervical spine was performed without intravenous contrast. Multiplanar CT image reconstructions were also generated. COMPARISON:  None. FINDINGS: Alignment: Normal Skull base and vertebrae: Negative for fracture or mass Soft tissues and spinal canal: Negative Disc levels:  C2-3:  Small central disc protrusion C3-4:  Mild disc bulging and spurring with mild spinal stenosis C4-5: Mild disc bulging and mild spurring with mild spinal stenosis. C5-6:  Diffuse bulging of the disc with mild spinal stenosis C6-7:  Negative C7-T1:  Negative Upper chest: Negative Other: None IMPRESSION: Cervical spine degenerative changes are relatively mild but do contribute to spinal stenosis at C3-4, C4-5, and C5-6. If there is concern of radiculopathy, consider MRI of the cervical spine. No acute skeletal abnormality identified. Electronically Signed   By: Marlan Palau M.D.   On: 01/24/2017 12:59   Ct Abdomen Pelvis W Contrast  Result Date: 01/24/2017 CLINICAL DATA:  56 year old male with history of Klebsiella bacteremia found unresponsive. Possible  endocarditis. EXAM: CT ABDOMEN AND PELVIS WITH CONTRAST TECHNIQUE: Multidetector CT imaging of the abdomen and pelvis was performed using the standard protocol following bolus administration of intravenous contrast. CONTRAST:  ISOVUE-300 IOPAMIDOL (ISOVUE-300) INJECTION 61% COMPARISON:  None. FINDINGS: Lower chest: Some dependent areas of peribronchovascular ground-glass attenuation and architectural distortion are noted, suspicious for mild aspiration pneumonitis. Hepatobiliary: No suspicious cystic or solid hepatic lesions. No intra or extrahepatic biliary ductal dilatation. Gallbladder is completely decompressed, but otherwise unremarkable in appearance. Pancreas: No pancreatic mass. No pancreatic ductal dilatation. No pancreatic or peripancreatic fluid or inflammatory changes. Spleen: Unremarkable. Adrenals/Urinary Tract: Bilateral kidneys and bilateral adrenal glands are normal in appearance. No hydroureteronephrosis. Small amount of gas non dependently in the lumen of the urinary bladder. Urinary bladder is otherwise unremarkable in appearance. Stomach/Bowel: Normal appearance of the stomach. No pathologic dilatation of small bowel or colon. Normal appendix. Vascular/Lymphatic: Aortic atherosclerosis, without evidence of aneurysm or dissection in the abdominal or pelvic vasculature. Circumaortic left renal vein (normal anatomical variant) incidentally noted. No lymphadenopathy noted in the abdomen or pelvis. Reproductive: Prostate gland and seminal vesicles are unremarkable in appearance. Other: Trace volume of ascites in the low anatomic pelvis. No pneumoperitoneum. Musculoskeletal: There are no aggressive appearing lytic or blastic lesions noted in the visualized portions of the skeleton. IMPRESSION: 1. Small amount of gas non dependently in the lumen of the urinary bladder. This may be iatrogenic if there has been recent catheterization for urinalysis. In the absence of a history of catheterization,  clinical correlation to exclude  urinary tract infection with gas-forming organisms would be recommended. 2. Trace volume of ascites in the low anatomic pelvis is of uncertain etiology, but is considered abnormal in a male patient. 3. Findings in the lung bases concerning for mild aspiration pneumonitis. Electronically Signed   By: Trudie Reedaniel  Entrikin M.D.   On: 01/24/2017 17:07   Dg Chest Portable 1 View  Result Date: 01/21/2017 CLINICAL DATA:  Intubation EXAM: PORTABLE CHEST 1 VIEW COMPARISON:  Portable exam 1734 hours without priors for comparison. FINDINGS: Tip of endotracheal tube projects 4.8 cm above carina. Nasogastric tube extends into stomach. Normal heart size, mediastinal contours, and pulmonary vascularity. Minimal atelectasis or infiltrate in retrocardiac LEFT lower lobe. Remaining lungs clear. No pleural effusion or pneumothorax. Osseous structures unremarkable. IMPRESSION: Minimal atelectasis versus infiltrate in retrocardiac LEFT lower lobe. Electronically Signed   By: Ulyses SouthwardMark  Boles M.D.   On: 01/21/2017 17:49   Dg Shoulder Left  Result Date: 01/22/2017 CLINICAL DATA:  Left shoulder pain after fall. EXAM: LEFT SHOULDER - 2+ VIEW COMPARISON:  None. FINDINGS: There is no evidence of fracture or dislocation. Moderate acromioclavicular osteoarthritis. Possible glenohumeral osteoarthritis. Soft tissues are unremarkable. IMPRESSION: Degenerative changes of the acromioclavicular and possibly glenohumeral joints. No fracture or dislocation. Electronically Signed   By: Rubye OaksMelanie  Ehinger M.D.   On: 01/22/2017 20:43   Dg Abd Portable 1 View  Result Date: 01/21/2017 CLINICAL DATA:  Nasogastric tube placement EXAM: PORTABLE ABDOMEN - 1 VIEW COMPARISON:  Portable exam 1734 hours without priors for comparison. FINDINGS: Nasogastric tube in stomach with tip projecting over distal antrum. Visualized bowel gas pattern normal. Atelectasis versus infiltrate in retrocardiac LEFT lower lobe. RIGHT lung base clear.  Osseous structures unremarkable. IMPRESSION: Tip of nasogastric tube projects over gastric antrum. Atelectasis versus infiltrate in retrocardiac LEFT lower lobe. Electronically Signed   By: Ulyses SouthwardMark  Boles M.D.   On: 01/21/2017 17:50   Subjective: No fevers, neck pain is stable. Taking po well.  Discharge Exam: Vitals:   01/26/17 0128 01/26/17 0442  BP:  118/79  Pulse:  68  Resp:  17  Temp: 99 F (37.2 C) 98.7 F (37.1 C)   General: Pt is alert, awake, not in acute distress Cardiovascular: RRR, S1/S2 +, no rubs, no gallops Respiratory: CTA bilaterally, no wheezing, no rhonchi Abdominal: Soft, NT, ND, bowel sounds + Extremities: No edema, no cyanosis  Labs: BNP (last 3 results) No results for input(s): BNP in the last 8760 hours. Basic Metabolic Panel:  Recent Labs Lab 01/21/17 1743 01/22/17 0352 01/24/17 0408 01/25/17 0417  NA 138 141 136 135  K 3.2* 3.4* 3.9 3.7  CL 102 110 103 102  CO2 26 24 24 24   GLUCOSE 123* 107* 124* 121*  BUN 10 8 8 7   CREATININE 1.17 1.00 0.98 0.90  CALCIUM 8.7* 8.8* 9.1 9.1  MG 1.8 2.5* 1.8  --   PHOS  --  2.2* 2.5  --    Liver Function Tests:  Recent Labs Lab 01/21/17 1743  AST 66*  ALT 48  ALKPHOS 98  BILITOT 0.3  PROT 7.6  ALBUMIN 3.7   No results for input(s): LIPASE, AMYLASE in the last 168 hours. No results for input(s): AMMONIA in the last 168 hours. CBC:  Recent Labs Lab 01/21/17 1743 01/22/17 0352 01/24/17 0408 01/25/17 0417  WBC 6.5 4.2 7.4 6.3  NEUTROABS 5.3  --   --   --   HGB 11.1* 11.0* 11.5* 11.9*  HCT 33.2* 33.2* 34.9* 35.0*  MCV 64.7* 64.6*  64.6* 63.1*  PLT 203 211 201 225   Cardiac Enzymes:  Recent Labs Lab 01/21/17 1743 01/21/17 2102 01/21/17 2159 01/22/17 0352  CKTOTAL 606*  --   --   --   TROPONINI 0.06* 0.11* 0.10* 0.05*   BNP: Invalid input(s): POCBNP CBG: No results for input(s): GLUCAP in the last 168 hours. D-Dimer No results for input(s): DDIMER in the last 72 hours. Hgb A1c No  results for input(s): HGBA1C in the last 72 hours. Lipid Profile No results for input(s): CHOL, HDL, LDLCALC, TRIG, CHOLHDL, LDLDIRECT in the last 72 hours. Thyroid function studies No results for input(s): TSH, T4TOTAL, T3FREE, THYROIDAB in the last 72 hours.  Invalid input(s): FREET3 Anemia work up No results for input(s): VITAMINB12, FOLATE, FERRITIN, TIBC, IRON, RETICCTPCT in the last 72 hours. Urinalysis    Component Value Date/Time   COLORURINE COLORLESS (A) 01/21/2017 1846   APPEARANCEUR CLEAR 01/21/2017 1846   LABSPEC 1.005 01/21/2017 1846   PHURINE 8.0 01/21/2017 1846   GLUCOSEU NEGATIVE 01/21/2017 1846   HGBUR SMALL (A) 01/21/2017 1846   BILIRUBINUR NEGATIVE 01/21/2017 1846   KETONESUR 5 (A) 01/21/2017 1846   PROTEINUR NEGATIVE 01/21/2017 1846   NITRITE NEGATIVE 01/21/2017 1846   LEUKOCYTESUR NEGATIVE 01/21/2017 1846    Microbiology Recent Results (from the past 240 hour(s))  MRSA PCR Screening     Status: None   Collection Time: 01/21/17  9:50 PM  Result Value Ref Range Status   MRSA by PCR NEGATIVE NEGATIVE Final    Comment:        The GeneXpert MRSA Assay (FDA approved for NASAL specimens only), is one component of a comprehensive MRSA colonization surveillance program. It is not intended to diagnose MRSA infection nor to guide or monitor treatment for MRSA infections.   Culture, blood (Routine X 2) w Reflex to ID Panel     Status: Abnormal   Collection Time: 01/23/17 10:02 AM  Result Value Ref Range Status   Specimen Description BLOOD RIGHT FOREARM  Final   Special Requests IN PEDIATRIC BOTTLE Blood Culture adequate volume  Final   Culture  Setup Time   Final    GRAM NEGATIVE RODS IN PEDIATRIC BOTTLE CRITICAL VALUE NOTED.  VALUE IS CONSISTENT WITH PREVIOUSLY REPORTED AND CALLED VALUE.    Culture (A)  Final    KLEBSIELLA PNEUMONIAE SUSCEPTIBILITIES PERFORMED ON PREVIOUS CULTURE WITHIN THE LAST 5 DAYS. Performed at [facility], [address] [place], [place] [address]    Report Status 01/26/2017 FINAL  Final  Culture, blood (Routine X 2) w Reflex to ID Panel     Status: Abnormal   Collection Time: 01/23/17 10:06 AM  Result Value Ref Range Status   Specimen Description BLOOD LEFT ARM  Final   Special Requests   Final    BOTTLES DRAWN AEROBIC AND ANAEROBIC Blood Culture adequate volume   Culture  Setup Time   Final    GRAM NEGATIVE RODS IN BOTH AEROBIC AND ANAEROBIC BOTTLES CRITICAL RESULT CALLED TO, READ BACK BY AND VERIFIED WITH: Sophronia Simas, PHARMD 01/24/17 0339 L CHAMPION Performed at [facility], [address] [place], [place] [address]    Culture KLEBSIELLA PNEUMONIAE (A)  Final   Report Status 01/26/2017 FINAL  Final   Organism ID, Bacteria KLEBSIELLA PNEUMONIAE  Final      Susceptibility   Klebsiella pneumoniae - MIC*    AMPICILLIN RESISTANT Resistant     CEFAZOLIN <=4 SENSITIVE Sensitive     CEFEPIME <=  1 SENSITIVE Sensitive     CEFTAZIDIME <=1 SENSITIVE Sensitive     CEFTRIAXONE <=1 SENSITIVE Sensitive     CIPROFLOXACIN <=0.25 SENSITIVE Sensitive     GENTAMICIN <=1 SENSITIVE Sensitive     IMIPENEM <=0.25 SENSITIVE Sensitive     TRIMETH/SULFA <=20 SENSITIVE Sensitive     AMPICILLIN/SULBACTAM 4 SENSITIVE Sensitive     PIP/TAZO <=4 SENSITIVE Sensitive     Extended ESBL NEGATIVE Sensitive     * KLEBSIELLA PNEUMONIAE  Blood Culture ID Panel (Reflexed)     Status: Abnormal   Collection Time: 01/23/17 10:06 AM  Result Value Ref Range Status   Enterococcus species NOT DETECTED NOT DETECTED Final   Listeria monocytogenes NOT DETECTED NOT DETECTED Final   Staphylococcus species NOT DETECTED NOT DETECTED Final   Staphylococcus aureus NOT DETECTED NOT DETECTED Final   Streptococcus species NOT DETECTED NOT DETECTED Final   Streptococcus agalactiae NOT DETECTED NOT DETECTED Final   Streptococcus pneumoniae NOT DETECTED NOT DETECTED Final   Streptococcus pyogenes NOT DETECTED NOT DETECTED Final   Acinetobacter  baumannii NOT DETECTED NOT DETECTED Final   Enterobacteriaceae species DETECTED (A) NOT DETECTED Final    Comment: Enterobacteriaceae represent a large family of gram-negative bacteria, not a single organism. CRITICAL RESULT CALLED TO, READ BACK BY AND VERIFIED WITH: Sophronia Simas, PHARMD 01/24/17 0339 L CHAMPION    Enterobacter cloacae complex NOT DETECTED NOT DETECTED Final   Escherichia coli NOT DETECTED NOT DETECTED Final   Klebsiella oxytoca NOT DETECTED NOT DETECTED Final   Klebsiella pneumoniae DETECTED (A) NOT DETECTED Final    Comment: CRITICAL RESULT CALLED TO, READ BACK BY AND VERIFIED WITH: Sophronia Simas, PHARMD 01/24/17 0339 L CHAMPION    Proteus species NOT DETECTED NOT DETECTED Final   Serratia marcescens NOT DETECTED NOT DETECTED Final   Carbapenem resistance NOT DETECTED NOT DETECTED Final   Haemophilus influenzae NOT DETECTED NOT DETECTED Final   Neisseria meningitidis NOT DETECTED NOT DETECTED Final   Pseudomonas aeruginosa NOT DETECTED NOT DETECTED Final   Candida albicans NOT DETECTED NOT DETECTED Final   Candida glabrata NOT DETECTED NOT DETECTED Final   Candida krusei NOT DETECTED NOT DETECTED Final   Candida parapsilosis NOT DETECTED NOT DETECTED Final   Candida tropicalis NOT DETECTED NOT DETECTED Final    Comment: Performed at [facility], [address] [place], [place] [address]    Time coordinating discharge: Approximately 40 minutes  Hazeline Junker, MD  [facility] 01/26/2017, 1:17 PM Pager [phone]

## 2017-01-26 NOTE — Progress Notes (Signed)
Pt states he pulled a tick off his back a couple of days ago, will report to MD. SRP, RN

## 2017-01-27 LAB — HIV ANTIBODY (ROUTINE TESTING W REFLEX): HIV Screen 4th Generation wRfx: NONREACTIVE

## 2017-01-30 DIAGNOSIS — M542 Cervicalgia: Secondary | ICD-10-CM

## 2017-01-30 DIAGNOSIS — T50901A Poisoning by unspecified drugs, medicaments and biological substances, accidental (unintentional), initial encounter: Secondary | ICD-10-CM

## 2017-02-01 LAB — HIV ANTIBODY (ROUTINE TESTING W REFLEX): HIV SCREEN 4TH GENERATION: NONREACTIVE

## 2017-02-20 ENCOUNTER — Encounter (HOSPITAL_COMMUNITY): Payer: Self-pay | Admitting: Cardiology

## 2017-03-13 ENCOUNTER — Emergency Department (HOSPITAL_COMMUNITY)
Admission: EM | Admit: 2017-03-13 | Discharge: 2017-03-13 | Disposition: A | Discharge status: Home / Self Care | Payer: Self-pay | Attending: Emergency Medicine | Admitting: Emergency Medicine

## 2017-03-13 ENCOUNTER — Encounter (HOSPITAL_COMMUNITY): Payer: Self-pay

## 2017-03-13 ENCOUNTER — Emergency Department (HOSPITAL_COMMUNITY): Payer: Self-pay

## 2017-03-13 DIAGNOSIS — M1990 Unspecified osteoarthritis, unspecified site: Secondary | ICD-10-CM

## 2017-03-13 DIAGNOSIS — M25512 Pain in left shoulder: Secondary | ICD-10-CM | POA: Insufficient documentation

## 2017-03-13 DIAGNOSIS — R05 Cough: Secondary | ICD-10-CM | POA: Insufficient documentation

## 2017-03-13 DIAGNOSIS — Z79899 Other long term (current) drug therapy: Secondary | ICD-10-CM | POA: Insufficient documentation

## 2017-03-13 DIAGNOSIS — Z87891 Personal history of nicotine dependence: Secondary | ICD-10-CM | POA: Insufficient documentation

## 2017-03-13 DIAGNOSIS — M542 Cervicalgia: Secondary | ICD-10-CM | POA: Insufficient documentation

## 2017-03-13 DIAGNOSIS — R5383 Other fatigue: Secondary | ICD-10-CM | POA: Insufficient documentation

## 2017-03-13 HISTORY — DX: Pneumonia, unspecified organism: J18.9

## 2017-03-13 NOTE — ED Provider Notes (Signed)
[facility] Provider Note   CSN: [id number] Arrival date & time: 03/13/17  1102     History   Chief Complaint Chief Complaint  Patient presents with  . Fatigue  . Chills  . Generalized Body Aches    HPI Taylor [name] is a 56 y.o. male.  56 year old male presents with multiple complaints consisting of persistent neck pain times several months as well as left shoulder discomfort. He also complains of vague myalgias with cough has been nonproductive. Denies any weight loss. No night sweats. Does have a history of polysubstance abuse. Denies any dyspnea. No vomiting or bloody stools. Symptoms persistent and no treatment use for this prior to arrival.      Past Medical History:  Diagnosis Date  . Back pain   . Pneumonia     Patient Active Problem List   Diagnosis Date Noted  . Drug overdose   . Neck pain on left side   . Bacteremia due to Klebsiella pneumoniae   . Opioid abuse with opioid-induced mood disorder (HCC) 01/23/2017  . Encephalopathy, toxic 01/21/2017  . Microcytic anemia 01/21/2017  . Toxic encephalopathy 01/21/2017    Past Surgical History:  Procedure Laterality Date  . TEE WITHOUT CARDIOVERSION N/A 01/24/2017   Procedure: TRANSESOPHAGEAL ECHOCARDIOGRAM (TEE);  Surgeon: Quintella Reicherturner, Traci R, MD;  [facility];  Service: Cardiovascular;  Laterality: N/A;       Home Medications    Prior to Admission medications   Medication Sig Start Date End Date Taking? Authorizing Provider  cyclobenzaprine (FLEXERIL) 10 MG tablet Take 1 tablet (10 mg total) by mouth 3 (three) times daily as needed for muscle spasms. Patient not taking: Reported on 01/21/2017 01/03/16   Charm RingsHonig, Erin J, MD  meloxicam (MOBIC) 15 MG tablet Take 1 tablet (15 mg total) by mouth daily. Patient not taking: Reported on 01/21/2017 01/03/16   Charm RingsHonig, Erin J, MD  sulfamethoxazole-trimethoprim (BACTRIM DS,SEPTRA DS) 800-160 MG tablet Take 1 tablet by mouth 2 (two) times daily. 01/26/17    Tyrone NineGrunz, Ryan B, MD    Family History Family History  Problem Relation Age of Onset  . Diabetes Mother     Social History Social History  Substance Use Topics  . Smoking status: Former Games developermoker  . Smokeless tobacco: Never Used  . Alcohol use Yes     Comment: social     Allergies   Patient has no known allergies.   Review of Systems Review of Systems  All other systems reviewed and are negative.    Physical Exam Updated Vital Signs BP 128/88 (BP Location: Right Arm)   Pulse 77   Temp 98.8 F (37.1 C) (Oral)   Resp 19   Ht 1.803 m (5\' 11" )   Wt 72.6 kg (160 lb)   SpO2 100%   BMI 22.32 kg/m   Physical Exam  Constitutional: He is oriented to person, place, and time. He appears well-developed and well-nourished.  Non-toxic appearance. No distress.  HENT:  Head: Normocephalic and atraumatic.  Eyes: Pupils are equal, round, and reactive to light. Conjunctivae, EOM and lids are normal.  Neck: Normal range of motion. Neck supple. No tracheal deviation present. No thyroid mass present.    Cardiovascular: Normal rate, regular rhythm and normal heart sounds.  Exam reveals no gallop.   No murmur heard. Pulmonary/Chest: Effort normal and breath sounds normal. No stridor. No respiratory distress. He has no decreased breath sounds. He has no wheezes. He has no rhonchi. He has no rales.  Abdominal: Soft.  Normal appearance and bowel sounds are normal. He exhibits no distension. There is no tenderness. There is no rebound and no CVA tenderness.  Musculoskeletal: Normal range of motion. He exhibits no edema or tenderness.       Left shoulder: He exhibits pain. He exhibits no deformity.       Arms: Neurological: He is alert and oriented to person, place, and time. He has normal strength. No cranial nerve deficit or sensory deficit. GCS eye subscore is 4. GCS verbal subscore is 5. GCS motor subscore is 6.  Skin: Skin is warm and dry. No abrasion and no rash noted.  Psychiatric: He  has a normal mood and affect. His speech is normal and behavior is normal.  Nursing note and vitals reviewed.    ED Treatments / Results  Labs (all labs ordered are listed, but only abnormal results are displayed) Labs Reviewed - No data to display  EKG  EKG Interpretation None       Radiology No results found.  Procedures Procedures (including critical care time)  Medications Ordered in ED Medications - No data to display   Initial Impression / Assessment and Plan / ED Course  I have reviewed the triage vital signs and the nursing notes.  Pertinent labs & imaging results that were available during my care of the patient were reviewed by me and considered in my medical decision making (see chart for details).     Patient's x-rays of his shoulder, C-spine, T-spine as well as chest x-ray acute findings. Will give referral to the community clinic  Final Clinical Impressions(s) / ED Diagnoses   Final diagnoses:  None    New Prescriptions New Prescriptions   No medications on file     Lorre Nick, MD 03/13/17 1839

## 2017-03-13 NOTE — ED Triage Notes (Signed)
Patient states he was diagnosed with pneumonia recently and is still having chills, feeling fatigued and having generalized body aches.

## 2017-03-13 NOTE — ED Notes (Signed)
Patient transported to X-ray 

## 2017-04-10 ENCOUNTER — Ambulatory Visit (HOSPITAL_COMMUNITY)
Admission: EM | Admit: 2017-04-10 | Discharge: 2017-04-10 | Disposition: A | Discharge status: Home / Self Care | Payer: Self-pay | Attending: Family Medicine | Admitting: Family Medicine

## 2017-04-10 ENCOUNTER — Encounter (HOSPITAL_COMMUNITY): Payer: Self-pay | Admitting: Emergency Medicine

## 2017-04-10 ENCOUNTER — Ambulatory Visit (INDEPENDENT_AMBULATORY_CARE_PROVIDER_SITE_OTHER): Payer: Self-pay

## 2017-04-10 DIAGNOSIS — M7502 Adhesive capsulitis of left shoulder: Secondary | ICD-10-CM

## 2017-04-10 NOTE — ED Triage Notes (Signed)
Pt here for left shoulder pain onset 2 months.... Reports he inj when he fell at home from a standing position.   Unable to raise arm above head.   Reports he went to [facility] for this ... X-rays were normal   Was told to f/u w/orthopeidc.  A&O x4... NAD... Ambulatory

## 2017-04-11 NOTE — ED Provider Notes (Signed)
  [facility]   [id number] 04/10/17 [facility]  1. Adhesive capsulitis of left shoulder    No dislocation on x-ray. Discussed at length. Recommend that he schedule f/u with an orthopaedist to discuss further evaluation and management. Work note given. OTC ibuprofen as needed. Reviewed expectations re: course of current medical issues. Questions answered. Outlined signs and symptoms indicating need for more acute intervention. Patient verbalized understanding. After Visit Summary given.   SUBJECTIVE:  Taylor [name] is a 55 y.o. male who presents with complaint of L shoulder pain and decreased ROM for the past six months after a fall. Has been seen by multiple healthcare providers for the same problem. Is convinced he has a shoulder dislocation. Has been referred to ortho in the past but has not gone. Reports not being able to lift his L arm above his head. Occasional ibuprofen for discomfort. No extremity sensation changes or weakness.  ROS: As per HPI.   OBJECTIVE:  Vitals:   04/10/17 1253  BP: (!) 142/83  Pulse: 88  Resp: 20  Temp: 98.9 F (37.2 C)  TempSrc: Oral  SpO2: 98%    General appearance: alert; no distress Neck: supple Lungs: clear to auscultation bilaterally Heart: regular rate and rhythm Extremities: LUE with decreased ROM; not able to lift arm above head; vague tenderness over anterior and superior shoulder Skin: warm and dry Neurologic: normal gait; normal symmetric reflexes Psychological: alert and cooperative; normal mood and affect  Imaging: Dg Shoulder Left  Result Date: 04/10/2017 CLINICAL DATA:  Patient states that he still has continued left shoulder pain since initial fall 6 months ago. Hx of shoulder exams EXAM: LEFT SHOULDER - 2+ VIEW COMPARISON:  03/13/2017 and 01/22/2017 FINDINGS: No fracture. Single small focus of calcification lies above the greater tuberosity consistent with calcific tendinitis, stable.  Minor osteophyte formation is noted along the inferior humeral head. No other glenohumeral joint arthropathic change. Moderate AC joint osteoarthritis. Soft tissues are unremarkable. IMPRESSION: 1. No fracture, bone lesion or acute finding. 2. No change from prior radiographs. 3. Minor glenohumeral joint arthropathic change. Moderate AC joint osteoarthritis. 4. Given the persistence of the patient's shoulder pain, follow-up shoulder MRI is suggested to evaluate for an internal derangement. Electronically Signed   By: Amie Portland M.D.   On: 04/10/2017 13:34    No Known Allergies  Past Medical History:  Diagnosis Date  . Back pain   . Pneumonia    Social History   Social History  . Marital status: Married    Spouse name: N/A  . Number of children: N/A  . Years of education: N/A   Occupational History  . Not on file.   Social History Main Topics  . Smoking status: Former Games developer  . Smokeless tobacco: Never Used  . Alcohol use Yes     Comment: social  . Drug use: Yes    Types: Heroin     Comment: heroin 2-3 days a week  . Sexual activity: Yes   Other Topics Concern  . Not on file   Social History Narrative  . No narrative on file   Family History  Problem Relation Age of Onset  . Diabetes Mother    Past Surgical History:  Procedure Laterality Date  . TEE WITHOUT CARDIOVERSION N/A 01/24/2017   Procedure: TRANSESOPHAGEAL ECHOCARDIOGRAM (TEE);  Surgeon: Quintella Reichert, MD;  Location: [facility] ENDOSCOPY;  Service: Cardiovascular;  Laterality: N/A;     Mardella Layman, MD 04/11/17 [phone]

## 2018-10-29 ENCOUNTER — Ambulatory Visit (HOSPITAL_COMMUNITY)
Admission: EM | Admit: 2018-10-29 | Discharge: 2018-10-29 | Disposition: A | Discharge status: Home / Self Care | Payer: Self-pay | Attending: Family Medicine | Admitting: Family Medicine

## 2018-10-29 ENCOUNTER — Encounter (HOSPITAL_COMMUNITY): Payer: Self-pay | Admitting: Emergency Medicine

## 2018-10-29 DIAGNOSIS — W2211XA Striking against or struck by driver side automobile airbag, initial encounter: Secondary | ICD-10-CM

## 2018-10-29 DIAGNOSIS — S46912A Strain of unspecified muscle, fascia and tendon at shoulder and upper arm level, left arm, initial encounter: Secondary | ICD-10-CM

## 2018-10-29 MED ORDER — CYCLOBENZAPRINE HCL 5 MG PO TABS: 5.0000 mg | ORAL_TABLET | Freq: Two times a day (BID) | ORAL | 0 refills | Status: DC | PRN

## 2018-10-29 MED ORDER — KETOROLAC TROMETHAMINE 30 MG/ML IJ SOLN
30.0000 mg | Freq: Once | INTRAMUSCULAR | Status: AC
  Administered 2018-10-29: 16:00:00 30 mg via INTRAMUSCULAR

## 2018-10-29 MED ORDER — KETOROLAC TROMETHAMINE 30 MG/ML IJ SOLN
INTRAMUSCULAR | Status: AC
  Filled 2018-10-29: qty 1

## 2018-10-29 MED ORDER — NAPROXEN 500 MG PO TABS: 500.0000 mg | ORAL_TABLET | Freq: Two times a day (BID) | ORAL | 0 refills | Status: DC
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
## 2018-10-29 NOTE — Discharge Instructions (Addendum)
Use anti-inflammatories for pain/swelling. You may take up to 800 mg Ibuprofen every 8 hours with food. You may supplement Ibuprofen with Tylenol [phone] mg every 8 hours.  OR Naprosyn twice daily  You may use flexeril as needed to help with pain. This is a muscle relaxer and causes sedation- please use only at bedtime or when you will be home and not have to drive/work  Work on shoulder exercises  Follow up if developing decreased movement in shoulder, increased pain

## 2018-10-31 ENCOUNTER — Ambulatory Visit (HOSPITAL_COMMUNITY)
Admission: EM | Admit: 2018-10-31 | Discharge: 2018-10-31 | Disposition: A | Discharge status: Home / Self Care | Payer: Self-pay | Attending: Family Medicine | Admitting: Family Medicine

## 2018-10-31 ENCOUNTER — Ambulatory Visit (INDEPENDENT_AMBULATORY_CARE_PROVIDER_SITE_OTHER): Payer: Self-pay

## 2018-10-31 ENCOUNTER — Encounter (HOSPITAL_COMMUNITY): Payer: Self-pay

## 2018-10-31 ENCOUNTER — Other Ambulatory Visit: Payer: Self-pay

## 2018-10-31 DIAGNOSIS — S61215A Laceration without foreign body of left ring finger without damage to nail, initial encounter: Secondary | ICD-10-CM

## 2018-10-31 DIAGNOSIS — Z23 Encounter for immunization: Secondary | ICD-10-CM

## 2018-10-31 DIAGNOSIS — S62663B Nondisplaced fracture of distal phalanx of left middle finger, initial encounter for open fracture: Secondary | ICD-10-CM

## 2018-10-31 DIAGNOSIS — Y93H2 Activity, gardening and landscaping: Secondary | ICD-10-CM

## 2018-10-31 DIAGNOSIS — W269XXA Contact with unspecified sharp object(s), initial encounter: Secondary | ICD-10-CM

## 2018-10-31 MED ORDER — TETANUS-DIPHTH-ACELL PERTUSSIS 5-2.5-18.5 LF-MCG/0.5 IM SUSP
0.5000 mL | Freq: Once | INTRAMUSCULAR | Status: AC
  Administered 2018-10-31: 20:00:00 0.5 mL via INTRAMUSCULAR

## 2018-10-31 MED ORDER — TETANUS-DIPHTH-ACELL PERTUSSIS 5-2.5-18.5 LF-MCG/0.5 IM SUSP
INTRAMUSCULAR | Status: AC
  Filled 2018-10-31: qty 0.5

## 2018-10-31 MED ORDER — DOXYCYCLINE HYCLATE 100 MG PO CAPS: 100.0000 mg | ORAL_CAPSULE | Freq: Two times a day (BID) | ORAL | 0 refills | Status: DC

## 2018-10-31 NOTE — ED Provider Notes (Signed)
[facility]    CSN: [id number] Arrival date & time: 10/31/18  1813     History   Chief Complaint Chief Complaint  Patient presents with  . finger laceration    HPI Taylor [name] is a 58 y.o. male.   58 year old male comes in for left hand finger lacerations that occurred today while trimming hedges. Vertical laceration to the left third and fourth finger with nail involvement. Denies numbness/tingling. Tied rubberbands around both fingers to stop bleeding. Able to move fingers. Unknown last tetanus.      Past Medical History:  Diagnosis Date  . Back pain   . Pneumonia     Patient Active Problem List   Diagnosis Date Noted  . Drug overdose   . Neck pain on left side   . Bacteremia due to Klebsiella pneumoniae   . Opioid abuse with opioid-induced mood disorder (HCC) 01/23/2017  . Encephalopathy, toxic 01/21/2017  . Microcytic anemia 01/21/2017  . Toxic encephalopathy 01/21/2017    Past Surgical History:  Procedure Laterality Date  . TEE WITHOUT CARDIOVERSION N/A 01/24/2017   Procedure: TRANSESOPHAGEAL ECHOCARDIOGRAM (TEE);  Surgeon: Quintella Reicherturner, Traci R, MD;  [facility];  Service: Cardiovascular;  Laterality: N/A;       Home Medications    Prior to Admission medications   Medication Sig Start Date End Date Taking? Authorizing Provider  cyclobenzaprine (FLEXERIL) 5 MG tablet Take 1-2 tablets (5-10 mg total) by mouth 2 (two) times daily as needed for muscle spasms. 10/29/18   Wieters, Hallie C, PA-C  doxycycline (VIBRAMYCIN) 100 MG capsule Take 1 capsule (100 mg total) by mouth 2 (two) times daily. 10/31/18   Cathie HoopsYu, Marajade Lei V, PA-C  ibuprofen (ADVIL,MOTRIN) 200 MG tablet Take 400 mg by mouth every 6 (six) hours as needed (should pain).    [provider]  naproxen (NAPROSYN) 500 MG tablet Take 1 tablet (500 mg total) by mouth 2 (two) times daily. 10/29/18   Wieters, Junius CreamerHallie C, PA-C    Family History Family History  Problem Relation Age of Onset   . Diabetes Mother     Social History Social History   Tobacco Use  . Smoking status: Former Games developermoker  . Smokeless tobacco: Never Used  Substance Use Topics  . Alcohol use: Yes    Comment: social  . Drug use: Yes    Types: Heroin    Comment: heroin 2-3 days a week     Allergies   Patient has no known allergies.   Review of Systems Review of Systems  Reason unable to perform ROS: See HPI as above.     Physical Exam Triage Vital Signs ED Triage Vitals  Enc Vitals Group     BP 10/31/18 1825 (!) 169/80     Pulse Rate 10/31/18 1825 (!) 105     Resp 10/31/18 1825 17     Temp 10/31/18 1825 98.6 F (37 C)     Temp Source 10/31/18 1825 Oral     SpO2 10/31/18 1825 98 %     Weight --      Height --      Head Circumference --      Peak Flow --      Pain Score 10/31/18 1824 6     Pain Loc --      Pain Edu? --      Excl. in GC? --    No data found.  Updated Vital Signs BP (!) 169/80 (BP Location: Right Arm)  Pulse (!) 105   Temp 98.6 F (37 C) (Oral)   Resp 17   SpO2 98%   Physical Exam Constitutional:      General: He is not in acute distress.    Appearance: He is well-developed. He is not diaphoretic.  HENT:     Head: Normocephalic and atraumatic.  Eyes:     Conjunctiva/sclera: Conjunctivae normal.     Pupils: Pupils are equal, round, and reactive to light.  Musculoskeletal:     Comments: See picture below. Multiple small lacerations to the 3rd and 4th distal finger. Bleeding controlled with pressure. Full ROM of finger. Sensation intact and equal bilaterally. Cap refill <2s.  Neurological:     Mental Status: He is alert and oriented to person, place, and time.                 UC Treatments / Results  Labs (all labs ordered are listed, but only abnormal results are displayed) Labs Reviewed - No data to display  EKG None  Radiology Dg Hand Complete Left  Result Date: 10/31/2018 CLINICAL DATA:  Lacerations to middle and ring fingers.  EXAM: LEFT HAND - COMPLETE 3+ VIEW COMPARISON:  None. FINDINGS: No radiopaque foreign object. Primarily vertical, minimally oblique fracture involving the tuft of the third digit. No intra-articular extension. IMPRESSION: 1. Third digit tuft fracture. 2. No radiopaque foreign body. Electronically Signed   By: Jeronimo Greaves M.D.   On: 10/31/2018 18:57    Procedures Laceration Repair Date/Time: 10/31/2018 7:54 PM Performed by: Belinda Fisher, PA-C Authorized by: Mardella Layman, MD   Consent:    Consent obtained:  Verbal   Consent given by:  Patient   Risks discussed:  Pain, poor cosmetic result, need for additional repair, nerve damage, poor wound healing, infection and vascular damage   Alternatives discussed:  Referral Anesthesia (see MAR for exact dosages):    Anesthesia method:  Nerve block   Block location:  3rd and 4th left finger   Block needle gauge:  27 G   Block anesthetic:  Lidocaine 2% w/o epi   Block injection procedure:  Anatomic landmarks identified, introduced needle, incremental injection, anatomic landmarks palpated and negative aspiration for blood   Block outcome:  Anesthesia achieved Laceration details:    Location:  Finger   Finger location: left long and ring finger.   Wound length (cm): long finger: 2cm, 1cm; ring finger: 1cm, 1cm, 0.3cm.   Laceration depth: long finger: 71mm, 66mm; ringer finger: 65mm. Repair type:    Repair type:  Intermediate Pre-procedure details:    Preparation:  Patient was prepped and draped in usual sterile fashion and imaging obtained to evaluate for foreign bodies Exploration:    Hemostasis achieved with:  Direct pressure and tourniquet   Wound exploration: wound explored through full range of motion and entire depth of wound probed and visualized   Treatment:    Area cleansed with:  Hibiclens   Amount of cleaning:  Extensive   Irrigation solution:  Sterile saline   Irrigation method:  Pressure wash   Visualized foreign bodies/material  removed: no   Skin repair:    Repair method:  Sutures   Suture size:  6-0   Suture material:  Prolene   Suture technique:  Simple interrupted   Number of sutures:  9 Approximation:    Approximation:  Loose Post-procedure details:    Dressing:  Antibiotic ointment, bulky dressing and splint for protection   Patient tolerance of procedure:  Tolerated well,  no immediate complications   (including critical care time)  Medications Ordered in UC Medications  Tdap (BOOSTRIX) injection 0.5 mL (0.5 mLs Intramuscular Given 10/31/18 1952)    Initial Impression / Assessment and Plan / UC Course  I have reviewed the triage vital signs and the nursing notes.  Pertinent labs & imaging results that were available during my care of the patient were reviewed by me and considered in my medical decision making (see chart for details).    Tetanus updated. Discussed xray results. Patient tolerated procedure well. 9 sutures placed. Wound care instructions given. Start doxycycline for open fracture of the left long finger. Return precautions given. Otherwise, follow up in 2 days with hand orthopedics for open fracture of the left long finger. Patient expresses understanding and agrees to plan.   Final Clinical Impressions(s) / UC Diagnoses   Final diagnoses:  Open nondisplaced fracture of distal phalanx of left middle finger, initial encounter  Laceration of left ring finger without foreign body without damage to nail, initial encounter   ED Prescriptions    Medication Sig Dispense Auth. Provider   doxycycline (VIBRAMYCIN) 100 MG capsule Take 1 capsule (100 mg total) by mouth 2 (two) times daily. 20 capsule Threasa Alpha, [place] 10/31/18 [phone]

## 2018-10-31 NOTE — Discharge Instructions (Signed)
9 stiches placed. Start doxycycline as directed to prevent infection. Tetanus updated. You can remove current dressing in 24 hours. Keep wound clean and dry. You can clean gently with soap and water. Do not soak area in water. Keep splint on middle finger as there is a fracture to the tip of the finger. Monitor for spreading redness, increased warmth, increased swelling, fever, follow up for reevaluation needed. Follow up with hand orthopedics in 2 days for evaluation and further management needed.

## 2018-10-31 NOTE — ED Triage Notes (Signed)
Patient presents to Urgent Care with complaints of finger lacerations to left hand on third and fourth fingers since this afternoon while trimming hedges. Lacerations are halfway through both fingernails.

## 2018-11-21 ENCOUNTER — Other Ambulatory Visit: Payer: Self-pay

## 2018-11-21 ENCOUNTER — Ambulatory Visit (HOSPITAL_COMMUNITY)
Admission: EM | Admit: 2018-11-21 | Discharge: 2018-11-21 | Disposition: A | Discharge status: Home / Self Care | Payer: Self-pay

## 2018-11-21 DIAGNOSIS — Z4802 Encounter for removal of sutures: Secondary | ICD-10-CM

## 2018-11-21 NOTE — ED Triage Notes (Signed)
Nine stitches removed from fingers on left hand.

## 2019-05-30 ENCOUNTER — Other Ambulatory Visit: Payer: Self-pay

## 2019-05-30 ENCOUNTER — Emergency Department (HOSPITAL_COMMUNITY)
Admission: EM | Admit: 2019-05-30 | Discharge: 2019-05-31 | Disposition: A | Discharge status: Home / Self Care | Payer: Self-pay | Attending: Emergency Medicine | Admitting: Emergency Medicine

## 2019-05-30 ENCOUNTER — Emergency Department (HOSPITAL_COMMUNITY): Payer: Self-pay

## 2019-05-30 DIAGNOSIS — M545 Low back pain: Secondary | ICD-10-CM | POA: Insufficient documentation

## 2019-05-30 DIAGNOSIS — Z20828 Contact with and (suspected) exposure to other viral communicable diseases: Secondary | ICD-10-CM | POA: Insufficient documentation

## 2019-05-30 DIAGNOSIS — R509 Fever, unspecified: Secondary | ICD-10-CM | POA: Insufficient documentation

## 2019-05-30 DIAGNOSIS — R0602 Shortness of breath: Secondary | ICD-10-CM | POA: Insufficient documentation

## 2019-05-30 DIAGNOSIS — M5440 Lumbago with sciatica, unspecified side: Secondary | ICD-10-CM

## 2019-05-30 DIAGNOSIS — Z87891 Personal history of nicotine dependence: Secondary | ICD-10-CM | POA: Insufficient documentation

## 2019-05-30 DIAGNOSIS — F1910 Other psychoactive substance abuse, uncomplicated: Secondary | ICD-10-CM | POA: Insufficient documentation

## 2019-05-30 LAB — COMPREHENSIVE METABOLIC PANEL
ALT: 17 U/L (ref 0–44)
AST: 17 U/L (ref 15–41)
Albumin: 3.7 g/dL (ref 3.5–5.0)
Alkaline Phosphatase: 73 U/L (ref 38–126)
Anion gap: 11 (ref 5–15)
BUN: 14 mg/dL (ref 6–20)
CO2: 23 mmol/L (ref 22–32)
Calcium: 9.4 mg/dL (ref 8.9–10.3)
Chloride: 103 mmol/L (ref 98–111)
Creatinine, Ser: 1.23 mg/dL (ref 0.61–1.24)
GFR calc Af Amer: >60 mL/min (ref >60)
GFR calc non Af Amer: >60 mL/min (ref >60)
Glucose, Bld: 119 mg/dL — ABNORMAL HIGH (ref 70–99)
Potassium: 3.6 mmol/L (ref 3.5–5.1)
Sodium: 137 mmol/L (ref 135–145)
Total Bilirubin: 1.1 mg/dL (ref 0.3–1.2)
Total Protein: 7.8 g/dL (ref 6.5–8.1)

## 2019-05-30 LAB — CBC WITH DIFFERENTIAL/PLATELET
Abs Immature Granulocytes: 0.03 10*3/uL (ref 0.00–0.07)
Basophils Absolute: 0 10*3/uL (ref 0.0–0.1)
Basophils Relative: 0 %
Eosinophils Absolute: 0 10*3/uL (ref 0.0–0.5)
Eosinophils Relative: 1 %
HCT: 37.6 % — ABNORMAL LOW (ref 39.0–52.0)
Hemoglobin: 12.2 g/dL — ABNORMAL LOW (ref 13.0–17.0)
Immature Granulocytes: 1 %
Lymphocytes Relative: 7 %
Lymphs Abs: 0.5 10*3/uL — ABNORMAL LOW (ref 0.7–4.0)
MCH: 21.9 pg — ABNORMAL LOW (ref 26.0–34.0)
MCHC: 32.4 g/dL (ref 30.0–36.0)
MCV: 67.6 fL — ABNORMAL LOW (ref 80.0–100.0)
Monocytes Absolute: 0.8 10*3/uL (ref 0.1–1.0)
Monocytes Relative: 12 %
Neutro Abs: 5.1 10*3/uL (ref 1.7–7.7)
Neutrophils Relative %: 79 %
Platelets: 214 10*3/uL (ref 150–400)
RBC: 5.56 MIL/uL (ref 4.22–5.81)
RDW: 17.5 % — ABNORMAL HIGH (ref 11.5–15.5)
WBC: 6.4 10*3/uL (ref 4.0–10.5)
nRBC: 0 % (ref 0.0–0.2)

## 2019-05-30 LAB — RAPID URINE DRUG SCREEN, HOSP PERFORMED
Amphetamines: NOT DETECTED
Barbiturates: NOT DETECTED
Benzodiazepines: NOT DETECTED
Cocaine: POSITIVE — AB
Opiates: NOT DETECTED
Tetrahydrocannabinol: NOT DETECTED

## 2019-05-30 LAB — POC SARS CORONAVIRUS 2 AG -  ED: SARS Coronavirus 2 Ag: NEGATIVE

## 2019-05-30 MED ORDER — ACETAMINOPHEN 325 MG PO TABS
650.0000 mg | ORAL_TABLET | Freq: Once | ORAL | Status: AC
  Administered 2019-05-30: 650 mg via ORAL
  Filled 2019-05-30: qty 2

## 2019-05-30 NOTE — ED Provider Notes (Signed)
[name] Provider Note   CSN: [id number] Arrival date & time: 05/30/19  1834     History   Chief Complaint Chief Complaint  Patient presents with  . Back Pain  . Drug Problem    HPI Taylor [name] is a 58 y.o. male.     58 y.o male with a PMH Drug overdose, Bacteremia presents to the ED with a chief complaint of back pain.  Patient reports he used heroin last this morning around 2 AM.  Reports he obtained heroin from his friend, he used some IV heroin last night, used a second dose this morning around 2 AM as he cannot fall asleep, he reports opening the door of his house in order to walk his dog when he suddenly fell onset of back pain, he endorses pain around the lower lumbar and lateral aspect of his back, this is worse with eating a deep breath.  He also endorses some chills, reports he is felt unwell for the past couple days.  He arrived in the ED febrile with a T-max of 100.  He also endorses some shortness of breath, worse with ambulation.  No chest pain, headaches, other complaints.  No bowel or bladder incontinence. Of note, patient does report taking a little bit of Suboxone today, he reports he has been trying to stop using IV heroin, however failed to do so today.  The history is provided by the patient.  Back Pain Location:  Lumbar spine Quality:  Aching Radiates to:  Does not radiate Pain severity:  Mild Pain is:  Same all the time Onset quality:  Sudden Duration:  1 day Timing:  Constant Progression:  Worsening Chronicity:  New Associated symptoms: fever   Associated symptoms: no abdominal pain and no chest pain   Drug Problem Associated symptoms include shortness of breath. Pertinent negatives include no chest pain and no abdominal pain.    Past Medical History:  Diagnosis Date  . Back pain   . Pneumonia     Patient Active Problem List   Diagnosis Date Noted  . Drug overdose   . Neck pain on left side   .  Bacteremia due to Klebsiella pneumoniae   . Opioid abuse with opioid-induced mood disorder ([place]) 01/23/2017  . Encephalopathy, toxic 01/21/2017  . Microcytic anemia 01/21/2017  . Toxic encephalopathy 01/21/2017    Past Surgical History:  Procedure Laterality Date  . TEE WITHOUT CARDIOVERSION N/A 01/24/2017   Procedure: TRANSESOPHAGEAL ECHOCARDIOGRAM (TEE);  Surgeon: Sueanne Margarita, MD;  Location: [facility] ENDOSCOPY;  Service: Cardiovascular;  Laterality: N/A;        Home Medications    Prior to Admission medications   Medication Sig Start Date End Date Taking? Authorizing Provider  cyclobenzaprine (FLEXERIL) 5 MG tablet Take 1-2 tablets (5-10 mg total) by mouth 2 (two) times daily as needed for muscle spasms. 10/29/18   Wieters, Hallie C, PA-C  doxycycline (VIBRAMYCIN) 100 MG capsule Take 1 capsule (100 mg total) by mouth 2 (two) times daily. 10/31/18   Tasia Catchings, Amy V, PA-C  ibuprofen (ADVIL,MOTRIN) 200 MG tablet Take 400 mg by mouth every 6 (six) hours as needed (should pain).    [provider]  naproxen (NAPROSYN) 500 MG tablet Take 1 tablet (500 mg total) by mouth 2 (two) times daily. 10/29/18   Wieters, Elesa Hacker, PA-C    Family History Family History  Problem Relation Age of Onset  . Diabetes Mother     Social History Social  History   Tobacco Use  . Smoking status: Former Games developer  . Smokeless tobacco: Never Used  Substance Use Topics  . Alcohol use: Yes    Comment: social  . Drug use: Yes    Types: Heroin    Comment: heroin 2-3 days a week     Allergies   Patient has no known allergies.   Review of Systems Review of Systems  Constitutional: Positive for fever.  Respiratory: Positive for shortness of breath.   Cardiovascular: Negative for chest pain.  Gastrointestinal: Negative for abdominal pain, nausea and vomiting.  Musculoskeletal: Positive for back pain.  Skin: Negative for pallor and wound.     Physical Exam Updated Vital Signs BP 120/81   Pulse 77    Temp 98.2 F (36.8 C) (Oral)   Resp 16   SpO2 99%   Physical Exam Vitals signs and nursing note reviewed.  Constitutional:      Appearance: He is well-developed. He is ill-appearing.  HENT:     Head: Normocephalic and atraumatic.  Eyes:     General: No scleral icterus.    Pupils: Pupils are equal, round, and reactive to light.  Neck:     Musculoskeletal: Normal range of motion.  Cardiovascular:     Rate and Rhythm: Tachycardia present.     Heart sounds: Normal heart sounds.  Pulmonary:     Effort: Pulmonary effort is normal.     Breath sounds: Normal breath sounds. No wheezing.     Comments: Lungs are clear without any wheezing, rhonchi, rales. Chest:     Chest wall: No tenderness.  Abdominal:     General: Bowel sounds are normal. There is no distension.     Palpations: Abdomen is soft.     Tenderness: There is no abdominal tenderness.     Comments: Abdomen is soft, nontender to palpation.  Musculoskeletal:        General: No tenderness or deformity.       Back:  Skin:    General: Skin is warm and dry.  Neurological:     Mental Status: He is alert and oriented to person, place, and time.     Comments: ANO x4.  No focal neuro deficit on my exam.      ED Treatments / Results  Labs (all labs ordered are listed, but only abnormal results are displayed) Labs Reviewed  RAPID URINE DRUG SCREEN, HOSP PERFORMED - Abnormal; Notable for the following components:      Result Value   Cocaine POSITIVE (*)    All other components within normal limits  CBC WITH DIFFERENTIAL/PLATELET - Abnormal; Notable for the following components:   Hemoglobin 12.2 (*)    HCT 37.6 (*)    MCV 67.6 (*)    MCH 21.9 (*)    RDW 17.5 (*)    Lymphs Abs 0.5 (*)    All other components within normal limits  COMPREHENSIVE METABOLIC PANEL - Abnormal; Notable for the following components:   Glucose, Bld 119 (*)    All other components within normal limits  POC SARS CORONAVIRUS 2 AG -  ED     EKG None  Radiology Dg Chest 2 View  Result Date: 05/30/2019 CLINICAL DATA:  Shortness of breath.  Chest pain. EXAM: CHEST - 2 VIEW COMPARISON:  03/13/2017 FINDINGS: The heart size and mediastinal contours are within normal limits. Both lungs are clear. The visualized skeletal structures are unremarkable. IMPRESSION: No active cardiopulmonary disease. Electronically Signed   By: Katherine Mantle  M.D.   On: 05/30/2019 19:54    Procedures Procedures (including critical care time)  Medications Ordered in ED Medications  acetaminophen (TYLENOL) tablet 650 mg (650 mg Oral Given 05/30/19 2105)     Initial Impression / Assessment and Plan / ED Course  I have reviewed the triage vital signs and the nursing notes.  Pertinent labs & imaging results that were available during my care of the patient were reviewed by me and considered in my medical decision making (see chart for details).       Patient with a past medical history of heroin abuse presents to the ED with sudden onset of back pain today while ambulating his dog.  He reports last using heroin yesterday, he had a second dose this morning around 2 AM.  He arrived in the ED febrile with a T-max of 100, tachycardia, blood pressure.   Last TEE from 2018 showed: There was no evidence of a vegetation.  LV EF: 60% -   65%       Portions of this note were generated with Scientist, clinical (histocompatibility and immunogenetics)Dragon dictation software. Dictation errors may occur despite best attempts at proofreading.  Final Clinical Impressions(s) / ED Diagnoses   Final diagnoses:  Substance abuse (HCC)  Acute right-sided low back pain with sciatica, sciatica laterality unspecified    ED Discharge Orders    None       Claude MangesSoto, Prabhav Faulkenberry, PA-C 05/31/19 2259    Arby BarrettePfeiffer, Marcy, MD 05/31/19 2355

## 2019-05-30 NOTE — ED Triage Notes (Signed)
Pt sts he opened the door to let his dog out and when the cold hit him his back started hurting. Also states someone gave him some heroin that he thinks was bad that he did at 1300 today. Pt thinks his whole body is "locking up." Pt ambulatory, in NAD.

## 2019-05-30 NOTE — ED Notes (Signed)
Patient transported to x-ray. ?

## 2019-05-31 NOTE — Discharge Instructions (Signed)
Your laboratory results were within normal limits.  Your chest xray was normal.  You may alternate tylenol or ibuprofen to help with your back pain.

## 2019-05-31 NOTE — ED Notes (Signed)
Patient verbalizes understanding of discharge instructions. Opportunity for questioning and answers were provided. Armband removed by staff, pt discharged from ED ambulatory.   

## 2019-07-29 ENCOUNTER — Ambulatory Visit: Payer: HRSA Program | Attending: Internal Medicine

## 2019-07-29 DIAGNOSIS — Z20822 Contact with and (suspected) exposure to covid-19: Secondary | ICD-10-CM | POA: Insufficient documentation

## 2019-07-30 ENCOUNTER — Telehealth: Payer: Self-pay | Admitting: General Practice

## 2019-07-30 LAB — NOVEL CORONAVIRUS, NAA: SARS-CoV-2, NAA: NOT DETECTED

## 2019-07-30 NOTE — Telephone Encounter (Signed)
Patient informed of negative covid-19 result. Patient verbalized understanding.  °

## 2020-02-09 ENCOUNTER — Ambulatory Visit (INDEPENDENT_AMBULATORY_CARE_PROVIDER_SITE_OTHER): Payer: Self-pay

## 2020-02-09 ENCOUNTER — Other Ambulatory Visit: Payer: Self-pay

## 2020-02-09 ENCOUNTER — Encounter (HOSPITAL_COMMUNITY): Payer: Self-pay | Admitting: *Deleted

## 2020-02-09 ENCOUNTER — Ambulatory Visit (HOSPITAL_COMMUNITY)
Admission: EM | Admit: 2020-02-09 | Discharge: 2020-02-09 | Disposition: A | Discharge status: Home / Self Care | Payer: Self-pay | Attending: Emergency Medicine | Admitting: Emergency Medicine

## 2020-02-09 DIAGNOSIS — M940 Chondrocostal junction syndrome [Tietze]: Secondary | ICD-10-CM | POA: Insufficient documentation

## 2020-02-09 DIAGNOSIS — R079 Chest pain, unspecified: Secondary | ICD-10-CM

## 2020-02-09 DIAGNOSIS — R630 Anorexia: Secondary | ICD-10-CM | POA: Insufficient documentation

## 2020-02-09 DIAGNOSIS — R0602 Shortness of breath: Secondary | ICD-10-CM

## 2020-02-09 DIAGNOSIS — R062 Wheezing: Secondary | ICD-10-CM

## 2020-02-09 DIAGNOSIS — Z20822 Contact with and (suspected) exposure to covid-19: Secondary | ICD-10-CM | POA: Insufficient documentation

## 2020-02-09 DIAGNOSIS — Z791 Long term (current) use of non-steroidal anti-inflammatories (NSAID): Secondary | ICD-10-CM | POA: Insufficient documentation

## 2020-02-09 DIAGNOSIS — M549 Dorsalgia, unspecified: Secondary | ICD-10-CM | POA: Insufficient documentation

## 2020-02-09 DIAGNOSIS — Z87891 Personal history of nicotine dependence: Secondary | ICD-10-CM | POA: Insufficient documentation

## 2020-02-09 LAB — POCT URINALYSIS DIPSTICK, ED / UC
Glucose, UA: NEGATIVE mg/dL
Ketones, ur: 40 mg/dL — AB
Leukocytes,Ua: NEGATIVE
Nitrite: NEGATIVE
Protein, ur: 30 mg/dL — AB
Specific Gravity, Urine: >=1.03 (ref 1.005–1.030)
Urobilinogen, UA: 0.2 mg/dL (ref 0.0–1.0)
pH: 5.5 (ref 5.0–8.0)

## 2020-02-09 MED ORDER — IBUPROFEN 800 MG PO TABS: 800.0000 mg | ORAL_TABLET | Freq: Three times a day (TID) | ORAL | 0 refills | Status: DC

## 2020-02-09 MED ORDER — CYCLOBENZAPRINE HCL 10 MG PO TABS: 10.0000 mg | ORAL_TABLET | Freq: Two times a day (BID) | ORAL | 0 refills | Status: DC | PRN

## 2020-02-09 NOTE — ED Provider Notes (Signed)
[facility]    CSN: [id number] Arrival date & time: 02/09/20  1213      History   Chief Complaint Chief Complaint  Patient presents with  . Back Pain  . Shortness of Breath    HPI Taylor [name] is a 59 y.o. male. Pain in his back that began yesterday morning. Pain is located in his left mid back/flank. Pain is worse with walking, movement. Denies nausea, urinary symptoms, incontinence.. Reports loss of appetite. Denies cough or congestion, shortness of breath. Describes pain as severe. And constantly is stating "something is wrong, something is not right". He has tried ibuprofen which took the edge off.     HPI  Past Medical History:  Diagnosis Date  . Back pain   . Pneumonia     Patient Active Problem List   Diagnosis Date Noted  . Drug overdose   . Neck pain on left side   . Bacteremia due to Klebsiella pneumoniae   . Opioid abuse with opioid-induced mood disorder (HCC) 01/23/2017  . Encephalopathy, toxic 01/21/2017  . Microcytic anemia 01/21/2017  . Toxic encephalopathy 01/21/2017    Past Surgical History:  Procedure Laterality Date  . TEE WITHOUT CARDIOVERSION N/A 01/24/2017   Procedure: TRANSESOPHAGEAL ECHOCARDIOGRAM (TEE);  Surgeon: Quintella Reichert, MD;  Location: [facility] ENDOSCOPY;  Service: Cardiovascular;  Laterality: N/A;       Home Medications    Prior to Admission medications   Medication Sig Start Date End Date Taking? Authorizing Provider  cyclobenzaprine (FLEXERIL) 10 MG tablet Take 1 tablet (10 mg total) by mouth 2 (two) times daily as needed for muscle spasms. 02/09/20   Chilton Si, PA-C  cyclobenzaprine (FLEXERIL) 5 MG tablet Take 1-2 tablets (5-10 mg total) by mouth 2 (two) times daily as needed for muscle spasms. 10/29/18   Wieters, Hallie C, PA-C  doxycycline (VIBRAMYCIN) 100 MG capsule Take 1 capsule (100 mg total) by mouth 2 (two) times daily. 10/31/18   Cathie Hoops, Amy V, PA-C  ibuprofen (ADVIL) 800 MG tablet Take 1 tablet (800 mg total)  by mouth 3 (three) times daily. 02/09/20   Chilton Si, PA-C  ibuprofen (ADVIL,MOTRIN) 200 MG tablet Take 400 mg by mouth every 6 (six) hours as needed (should pain).    [provider]  naproxen (NAPROSYN) 500 MG tablet Take 1 tablet (500 mg total) by mouth 2 (two) times daily. 10/29/18   Wieters, Junius Creamer, PA-C    Family History Family History  Problem Relation Age of Onset  . Diabetes Mother     Social History Social History   Tobacco Use  . Smoking status: Former Games developer  . Smokeless tobacco: Never Used  Vaping Use  . Vaping Use: Never used  Substance Use Topics  . Alcohol use: Yes    Comment: social  . Drug use: Yes    Types: Heroin    Comment:  heroin in past; cocaine 02/08/2020     Allergies   Patient has no known allergies.   Review of Systems Review of Systems  Constitutional: Positive for appetite change. Negative for chills and fever.  Respiratory: Negative for cough, shortness of breath and wheezing.   Cardiovascular: Negative for chest pain.  Gastrointestinal: Negative for diarrhea, nausea and vomiting.  Genitourinary: Negative for difficulty urinating, dysuria and frequency.  Musculoskeletal: Positive for back pain.  Neurological: Negative for dizziness, weakness, numbness and headaches.     Physical Exam Triage Vital Signs ED Triage Vitals  Enc Vitals Group  BP 02/09/20 1232 135/75     Pulse Rate 02/09/20 1232 90     Resp 02/09/20 1232 16     Temp 02/09/20 1232 98.7 F (37.1 C)     Temp Source 02/09/20 1232 Oral     SpO2 02/09/20 1232 99 %     Weight --      Height --      Head Circumference --      Peak Flow --      Pain Score 02/09/20 1234 10     Pain Loc --      Pain Edu? --      Excl. in GC? --    No data found.  Updated Vital Signs BP 135/75   Pulse 90   Temp 98.7 F (37.1 C) (Oral)   Resp 16   SpO2 99%   Physical Exam Vitals reviewed.  Constitutional:      General: He is not in acute distress.     Appearance: He is normal weight. He is not ill-appearing.  HENT:     Head: Normocephalic and atraumatic.  Cardiovascular:     Rate and Rhythm: Normal rate and regular rhythm.     Heart sounds: Normal heart sounds. No murmur heard.  No friction rub.  Pulmonary:     Effort: Pulmonary effort is normal.     Breath sounds: Normal breath sounds. No decreased breath sounds, wheezing, rhonchi or rales.  Chest:     Chest wall: No mass or tenderness.  Abdominal:     General: Bowel sounds are normal.     Palpations: Abdomen is soft. There is no hepatomegaly or splenomegaly.  Skin:    General: Skin is warm.     Coloration: Skin is not pale.     Findings: No erythema.  Neurological:     Mental Status: He is alert.  Psychiatric:        Mood and Affect: Mood is anxious.        Behavior: Behavior is agitated.      UC Treatments / Results  Labs (all labs ordered are listed, but only abnormal results are displayed) Labs Reviewed  POCT URINALYSIS DIPSTICK, ED / UC - Abnormal; Notable for the following components:      Result Value   Bilirubin Urine SMALL (*)    Ketones, ur 40 (*)    Hgb urine dipstick TRACE (*)    Protein, ur 30 (*)    All other components within normal limits  SARS CORONAVIRUS 2 (TAT 6-24 HRS)    EKG   Radiology DG Chest 2 View  Result Date: 02/09/2020 CLINICAL DATA:  Chest and LEFT flank pain. Shortness of breath and wheezing. Smoker. EXAM: CHEST - 2 VIEW COMPARISON:  05/30/2019 FINDINGS: The heart size and mediastinal contours are within normal limits. Both lungs are clear. The visualized skeletal structures are unremarkable. IMPRESSION: No active cardiopulmonary disease. Electronically Signed   By: Norva Pavlov M.D.   On: 02/09/2020 14:38    Procedures Procedures (including critical care time)  Medications Ordered in UC Medications - No data to display  Initial Impression / Assessment and Plan / UC Course  I have reviewed the triage vital signs and the  nursing notes.  Pertinent labs & imaging results that were available during my care of the patient were reviewed by me and considered in my medical decision making (see chart for details).     Back/flank pain Pain is out of proportion to exam. UA is was  negative for infection. He does have ketones and protein in his urine. Could be due to lack or fluid but seems unlikely since symptoms began yesterday. I recommended Ibuprofen 800 mg and muscle relaxants to help with the pain. Pending COVID test due to symptoms of lack of appetite. Chest xray was normal. Advised if symptoms worsen to follow-up with the ED. Final Clinical Impressions(s) / UC Diagnoses   Final diagnoses:  Costochondritis     Discharge Instructions     You urine indicated you are not drinking enough fluids. Please drink small sips frequently to prevent dehydration. Eat small meals as well. Take medication as prescribed and follow-up with the ER if symptoms worsen.     ED Prescriptions    Medication Sig Dispense Auth. Provider   ibuprofen (ADVIL) 800 MG tablet Take 1 tablet (800 mg total) by mouth 3 (three) times daily. 21 tablet Thomasene Lot S, PA-C   cyclobenzaprine (FLEXERIL) 10 MG tablet Take 1 tablet (10 mg total) by mouth 2 (two) times daily as needed for muscle spasms. 20 tablet Chilton Si, [place]     PDMP not reviewed this encounter.   Chilton Si, PA-C 02/09/20 1456

## 2020-02-09 NOTE — Discharge Instructions (Signed)
You urine indicated you are not drinking enough fluids. Please drink small sips frequently to prevent dehydration. Eat small meals as well. Take medication as prescribed and follow-up with the ER if symptoms worsen.

## 2020-02-09 NOTE — ED Triage Notes (Addendum)
C/O waking up with left mid-back pain, "under my ribs" yesterday.  C/O severe pain with any movements or breathing.  States feels SOB due to painful breathing.  "I need water in my body" - denies vomiting, but states "nothing tastes good".  Pt speaking in complete sentences, occasionally winces with pain upon movement in wheelchair.  Pt has concerned about possible kidney problem.

## 2020-02-10 LAB — SARS CORONAVIRUS 2 (TAT 6-24 HRS): SARS Coronavirus 2: NEGATIVE

## 2020-05-04 ENCOUNTER — Ambulatory Visit (HOSPITAL_COMMUNITY)
Admission: EM | Admit: 2020-05-04 | Discharge: 2020-05-04 | Disposition: A | Discharge status: Home / Self Care | Payer: Self-pay | Attending: Urgent Care | Admitting: Urgent Care

## 2020-05-04 ENCOUNTER — Other Ambulatory Visit: Payer: Self-pay

## 2020-05-04 ENCOUNTER — Encounter (HOSPITAL_COMMUNITY): Payer: Self-pay | Admitting: Emergency Medicine

## 2020-05-04 DIAGNOSIS — R3915 Urgency of urination: Secondary | ICD-10-CM | POA: Insufficient documentation

## 2020-05-04 DIAGNOSIS — R369 Urethral discharge, unspecified: Secondary | ICD-10-CM | POA: Insufficient documentation

## 2020-05-04 DIAGNOSIS — Z7251 High risk heterosexual behavior: Secondary | ICD-10-CM | POA: Insufficient documentation

## 2020-05-04 MED ORDER — LIDOCAINE HCL (PF) 1 % IJ SOLN
INTRAMUSCULAR | Status: AC
  Filled 2020-05-04: qty 2

## 2020-05-04 MED ORDER — AZITHROMYCIN 250 MG PO TABS
ORAL_TABLET | ORAL | Status: AC
  Filled 2020-05-04: qty 4

## 2020-05-04 MED ORDER — CEFTRIAXONE SODIUM 500 MG IJ SOLR
500.0000 mg | Freq: Once | INTRAMUSCULAR | Status: AC
  Administered 2020-05-04: 500 mg via INTRAMUSCULAR

## 2020-05-04 MED ORDER — AZITHROMYCIN 250 MG PO TABS
1000.0000 mg | ORAL_TABLET | Freq: Once | ORAL | Status: AC
  Administered 2020-05-04: 1000 mg via ORAL

## 2020-05-04 MED ORDER — CEFTRIAXONE SODIUM 500 MG IJ SOLR
INTRAMUSCULAR | Status: AC
  Filled 2020-05-04: qty 500

## 2020-05-04 NOTE — ED Triage Notes (Signed)
Pt c/o urinary retention x 4 days. He states he has the urgency to urinate but then only a little urine comes out. He states he also has dysuria. He denies penile bleeding. He states he has a slight penile discharge.

## 2020-05-04 NOTE — Discharge Instructions (Addendum)
Avoid all forms of sexual intercourse (oral, vaginal, anal) for the next 7 days to avoid spreading/reinfecting. Return if symptoms worsen/do not resolve, you develop fever, abdominal pain, blood in your urine, or are re-exposed to an STI.  

## 2020-05-04 NOTE — ED Provider Notes (Signed)
  [facility]   MRN: [id number] DOB: March 07, 1961  Subjective:   Taylor [name] is a 59 y.o. male presenting for 1.5 week hx of acute onset dysuria, penile discharge. States that he gets the urge to urinate, does not always completely empty his bladder. He is married but had sex with another woman recently, condom broke.   Denies taking any medications.    No Known Allergies  Past Medical History:  Diagnosis Date  . Back pain   . Pneumonia      Past Surgical History:  Procedure Laterality Date  . TEE WITHOUT CARDIOVERSION N/A 01/24/2017   Procedure: TRANSESOPHAGEAL ECHOCARDIOGRAM (TEE);  Surgeon: Quintella Reichert, MD;  Location: [facility] ENDOSCOPY;  Service: Cardiovascular;  Laterality: N/A;    Family History  Problem Relation Age of Onset  . Diabetes Mother     Social History   Tobacco Use  . Smoking status: Former Games developer  . Smokeless tobacco: Never Used  Vaping Use  . Vaping Use: Never used  Substance Use Topics  . Alcohol use: Yes    Comment: social  . 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    Appearance: Normal appearance. He is well-developed and normal weight. He is not ill-appearing, toxic-appearing or diaphoretic.  HENT:     Head: Normocephalic and atraumatic.     Right Ear: External ear normal.     Left Ear: External ear normal.     Nose: Nose normal.     Mouth/Throat:     Pharynx: Oropharynx is clear.  Eyes:     General: No scleral icterus.       Right eye: No discharge.        Left eye: No discharge.     Extraocular Movements: Extraocular movements intact.     Pupils: Pupils are equal, round, and reactive to light.  Cardiovascular:     Rate and Rhythm: Normal rate.  Pulmonary:     Effort:  Pulmonary effort is normal.  Genitourinary:    Penis: Circumcised. Discharge present. No phimosis, paraphimosis, hypospadias, erythema, tenderness, swelling or lesions.   Musculoskeletal:     Cervical back: Normal range of motion.  Neurological:     Mental Status: He is alert and oriented to person, place, and time.  Psychiatric:        Mood and Affect: Mood normal.        Behavior: Behavior normal.        Thought Content: Thought content normal.        Judgment: Judgment normal.      Assessment and Plan :   PDMP not reviewed this encounter.  1. Penile discharge   2. Urinary urgency   3. High risk heterosexual behavior     Patient treated empirically as per CDC guidelines with IM ceftriaxone, azithromycin in clinic.  Labs pending.   Counseled on safe sex practices including abstaining for 1 week following treatment.  Counseled patient on potential for adverse effects with medications prescribed/recommended today, ER and return-to-clinic precautions discussed, patient verbalized understanding.    Wallis Bamberg, [place] 05/04/20 [phone]

## 2020-05-07 LAB — CYTOLOGY, (ORAL, ANAL, URETHRAL) ANCILLARY ONLY
Chlamydia: NEGATIVE
Comment: NEGATIVE
Comment: NEGATIVE
Comment: NORMAL
Neisseria Gonorrhea: POSITIVE — AB
Trichomonas: NEGATIVE

## 2020-05-27 IMAGING — DX DG CHEST 2V
3 series · 3 of 3 positions shown · non-contrast
Comparison: 03/13/2017

CLINICAL DATA: Shortness of breath.  Chest pain.

EXAM:
CHEST - 2 VIEW

[chest pa]
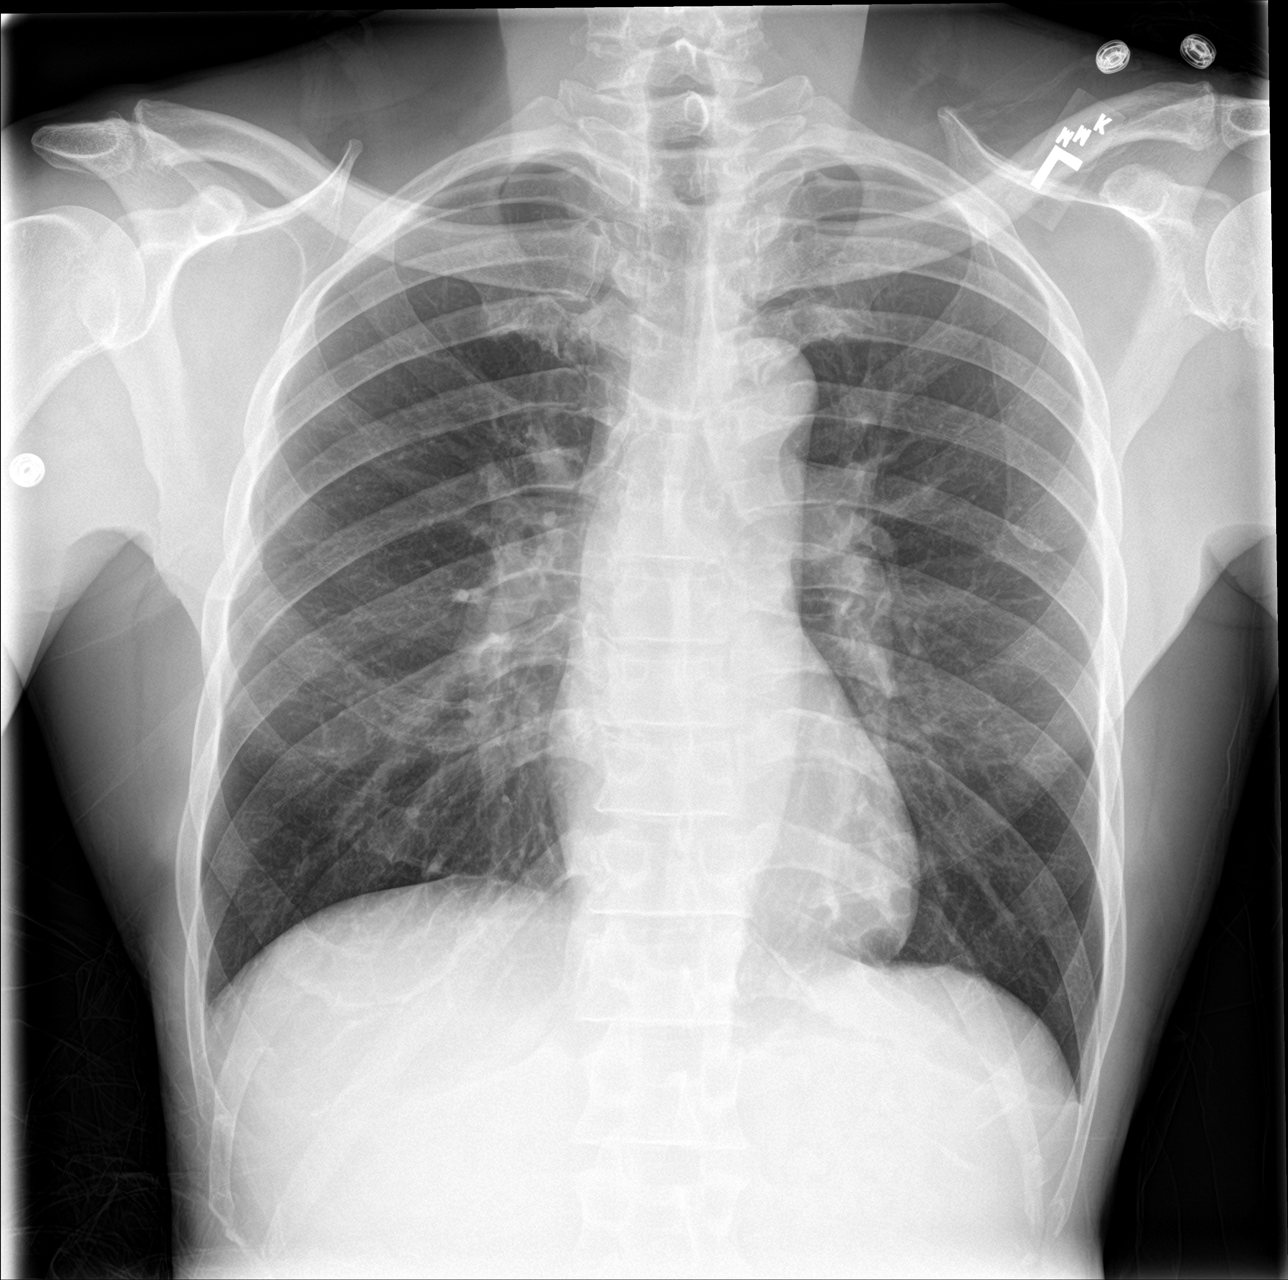

[chest lat (1 of 2)]
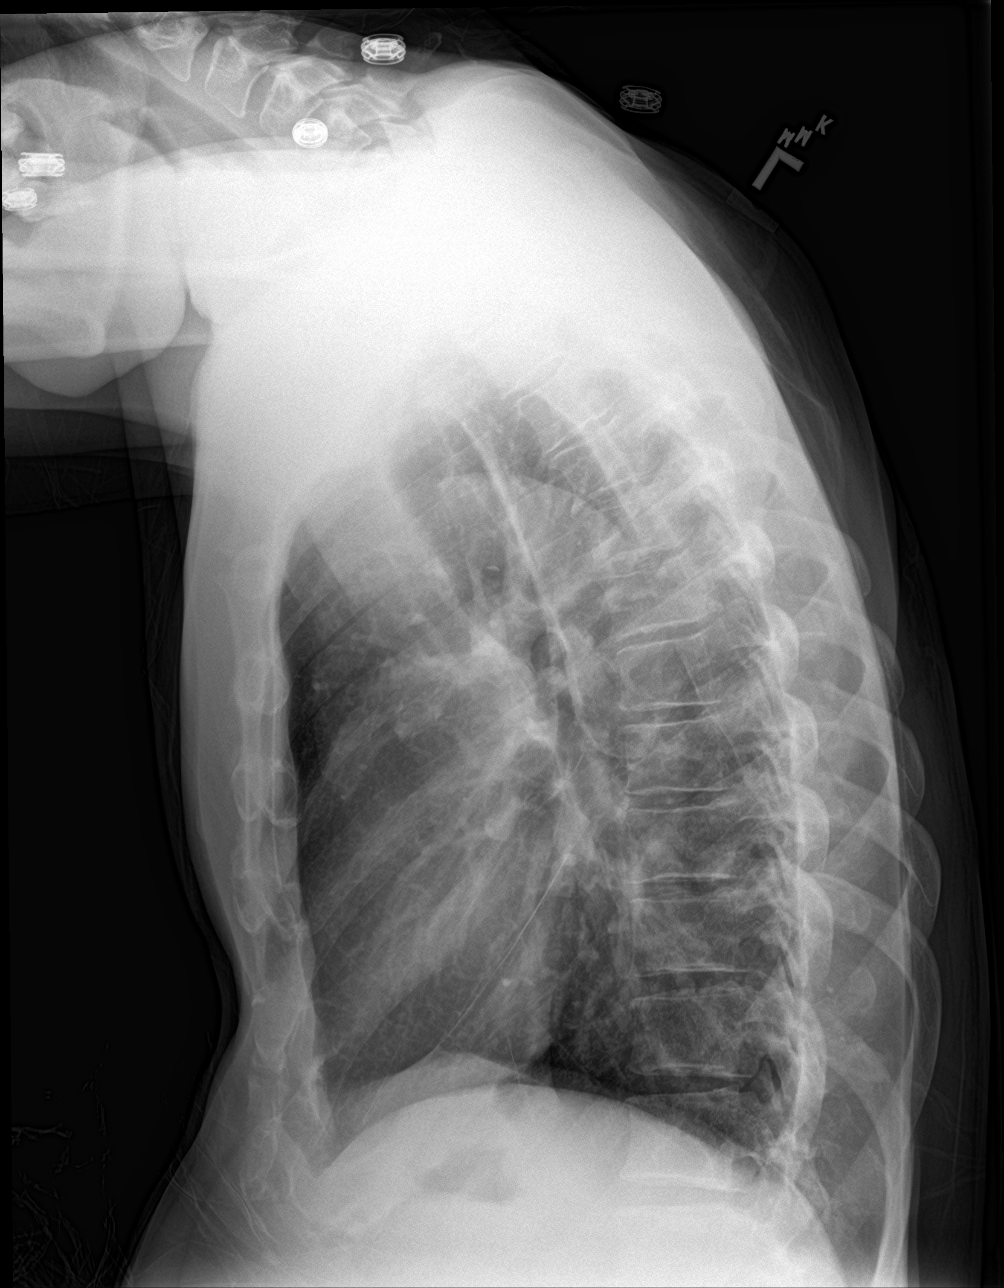

[chest lat (2 of 2)]
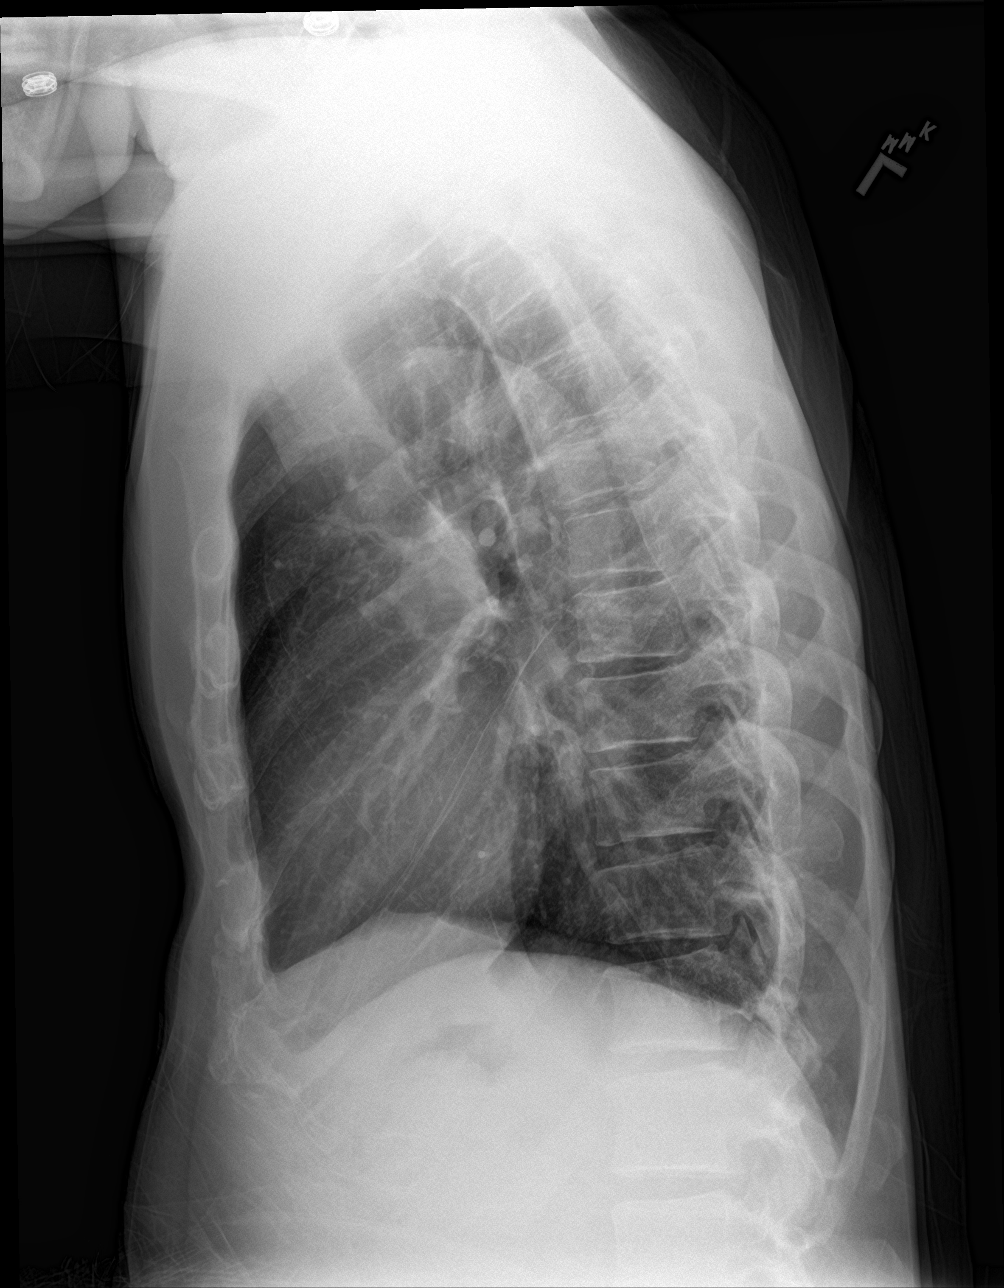

[3 of 3 positions shown; findings below may reference images not displayed]

FINDINGS: The heart size and mediastinal contours are within normal limits.
Both lungs are clear. The visualized skeletal structures are
unremarkable.
IMPRESSION: No active cardiopulmonary disease.

## 2021-02-06 IMAGING — DX DG CHEST 2V
2 series · 2 of 2 positions shown · non-contrast
Comparison: 05/30/2019

CLINICAL DATA: Chest and LEFT flank pain. Shortness of breath and
wheezing. Smoker.

EXAM:
CHEST - 2 VIEW

[chest pa]
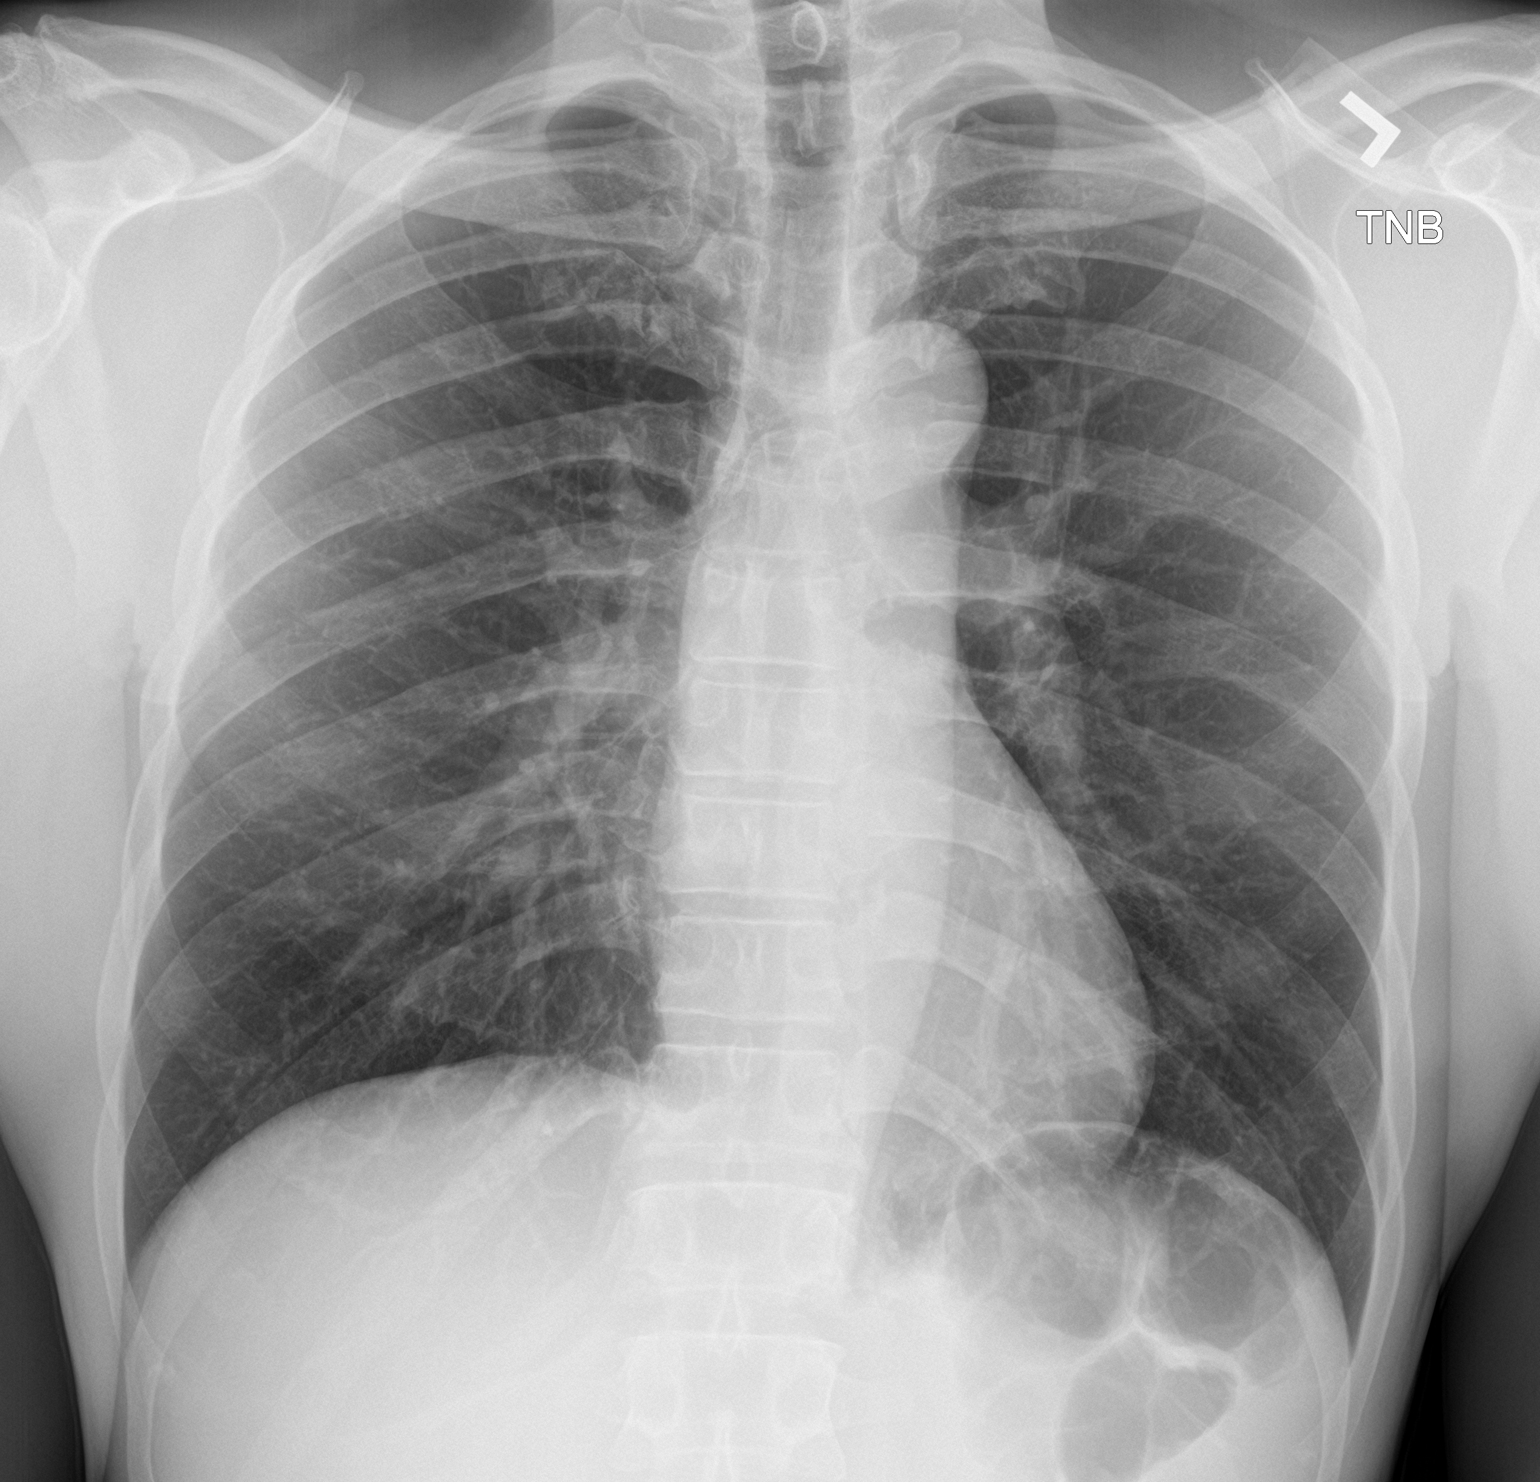

[chest lat]
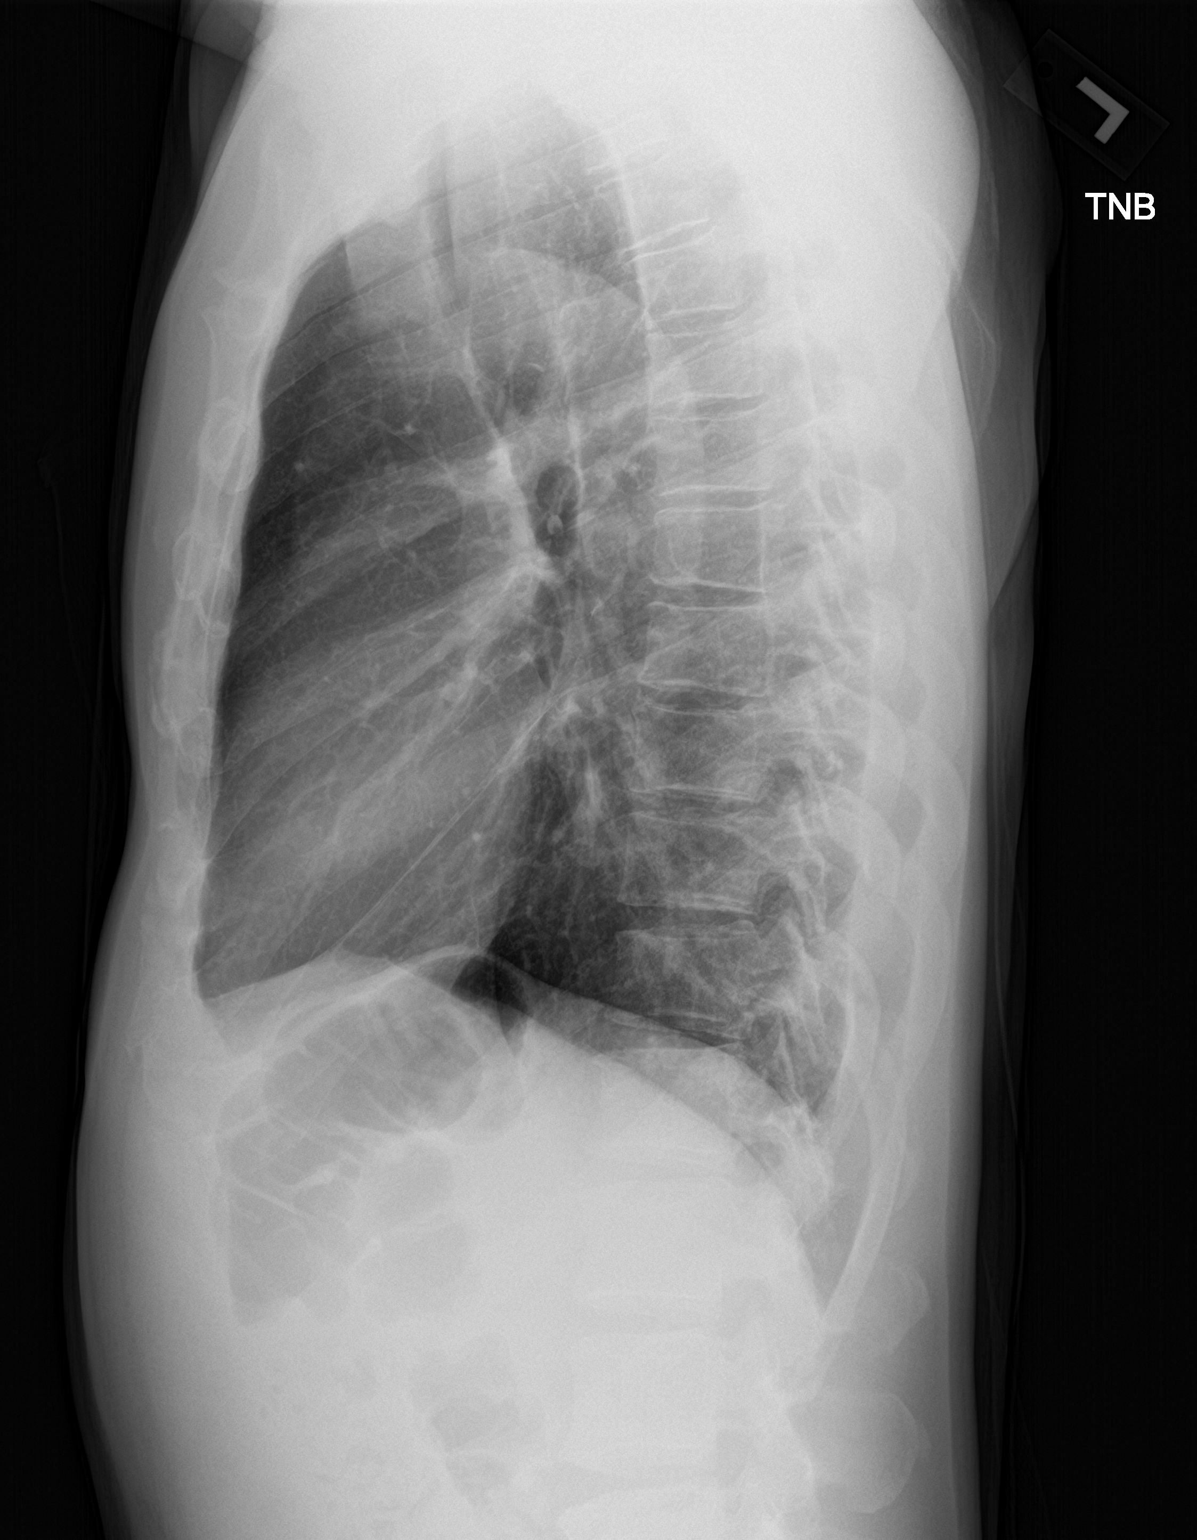

[2 of 2 positions shown; findings below may reference images not displayed]

FINDINGS: The heart size and mediastinal contours are within normal limits.
Both lungs are clear. The visualized skeletal structures are
unremarkable.
IMPRESSION: No active cardiopulmonary disease.
